# Patient Record
Sex: Male | Born: 1975
Health system: Southern US, Community
[De-identification: ages and names within clinical notes are randomized; demographics above are authoritative.]

## PROBLEM LIST (undated history)

## (undated) DIAGNOSIS — K219 Gastro-esophageal reflux disease without esophagitis: Secondary | ICD-10-CM

## (undated) DIAGNOSIS — N200 Calculus of kidney: Secondary | ICD-10-CM

## (undated) DIAGNOSIS — F419 Anxiety disorder, unspecified: Secondary | ICD-10-CM

## (undated) HISTORY — DX: Anxiety disorder, unspecified: F41.9

## (undated) HISTORY — PX: KIDNEY STONE SURGERY: SHX686

## (undated) HISTORY — PX: HERNIA REPAIR: SHX51

## (undated) HISTORY — PX: LITHOTRIPSY: SUR834

## (undated) HISTORY — DX: Gastro-esophageal reflux disease without esophagitis: K21.9

## (undated) HISTORY — DX: Calculus of kidney: N20.0

---

## 2004-06-17 ENCOUNTER — Emergency Department: Payer: Self-pay | Admitting: Emergency Medicine

## 2004-10-27 ENCOUNTER — Ambulatory Visit: Payer: Self-pay

## 2008-03-22 ENCOUNTER — Ambulatory Visit: Payer: Self-pay | Admitting: Urology

## 2010-01-01 ENCOUNTER — Ambulatory Visit: Payer: Self-pay | Admitting: Family Medicine

## 2010-04-29 ENCOUNTER — Emergency Department (HOSPITAL_COMMUNITY)
Admission: EM | Admit: 2010-04-29 | Discharge: 2010-04-30 | Payer: Self-pay | Source: Home / Self Care | Admitting: Emergency Medicine

## 2010-04-29 ENCOUNTER — Ambulatory Visit: Payer: Self-pay | Admitting: Urology

## 2010-07-12 ENCOUNTER — Emergency Department (HOSPITAL_COMMUNITY): Payer: PRIVATE HEALTH INSURANCE

## 2010-07-12 ENCOUNTER — Emergency Department (HOSPITAL_COMMUNITY)
Admission: EM | Admit: 2010-07-12 | Discharge: 2010-07-12 | Disposition: A | Discharge status: Home / Self Care | Payer: PRIVATE HEALTH INSURANCE | Attending: Emergency Medicine | Admitting: Emergency Medicine

## 2010-07-12 DIAGNOSIS — R079 Chest pain, unspecified: Secondary | ICD-10-CM | POA: Insufficient documentation

## 2010-07-12 DIAGNOSIS — M62838 Other muscle spasm: Secondary | ICD-10-CM | POA: Insufficient documentation

## 2010-07-12 DIAGNOSIS — M546 Pain in thoracic spine: Secondary | ICD-10-CM | POA: Insufficient documentation

## 2010-07-12 LAB — URINALYSIS, ROUTINE W REFLEX MICROSCOPIC
Bilirubin Urine: NEGATIVE
Glucose, UA: NEGATIVE mg/dL
Hgb urine dipstick: NEGATIVE
Ketones, ur: NEGATIVE mg/dL
Nitrite: NEGATIVE
Protein, ur: NEGATIVE mg/dL
Specific Gravity, Urine: 1.006 (ref 1.005–1.030)
Urobilinogen, UA: 0.2 mg/dL (ref 0.0–1.0)
pH: 6 (ref 5.0–8.0)

## 2010-07-14 LAB — DIFFERENTIAL
Basophils Absolute: 0 10*3/uL (ref 0.0–0.1)
Basophils Relative: 0 % (ref 0–1)
Eosinophils Absolute: 0 10*3/uL (ref 0.0–0.7)
Eosinophils Relative: 0 % (ref 0–5)
Lymphocytes Relative: 15 % (ref 12–46)
Lymphs Abs: 1.8 10*3/uL (ref 0.7–4.0)
Monocytes Absolute: 0.7 10*3/uL (ref 0.1–1.0)
Monocytes Relative: 6 % (ref 3–12)
Neutro Abs: 9.4 10*3/uL — ABNORMAL HIGH (ref 1.7–7.7)
Neutrophils Relative %: 79 % — ABNORMAL HIGH (ref 43–77)

## 2010-07-14 LAB — URINALYSIS, ROUTINE W REFLEX MICROSCOPIC
Bilirubin Urine: NEGATIVE
Glucose, UA: NEGATIVE mg/dL
Hgb urine dipstick: NEGATIVE
Ketones, ur: 40 mg/dL — AB
Nitrite: NEGATIVE
Protein, ur: NEGATIVE mg/dL
Specific Gravity, Urine: 1.02 (ref 1.005–1.030)
Urobilinogen, UA: 0.2 mg/dL (ref 0.0–1.0)
pH: 7 (ref 5.0–8.0)

## 2010-07-14 LAB — BASIC METABOLIC PANEL
BUN: 7 mg/dL (ref 6–23)
CO2: 26 mEq/L (ref 19–32)
Calcium: 9.3 mg/dL (ref 8.4–10.5)
Chloride: 104 mEq/L (ref 96–112)
Creatinine, Ser: 0.89 mg/dL (ref 0.4–1.5)
GFR calc Af Amer: >60 mL/min (ref >60)
GFR calc non Af Amer: >60 mL/min (ref >60)
Glucose, Bld: 95 mg/dL (ref 70–99)
Potassium: 3.8 mEq/L (ref 3.5–5.1)
Sodium: 138 mEq/L (ref 135–145)

## 2010-07-14 LAB — CBC
HCT: 44.7 % (ref 39.0–52.0)
Hemoglobin: 15.6 g/dL (ref 13.0–17.0)
MCH: 31.2 pg (ref 26.0–34.0)
MCHC: 34.9 g/dL (ref 30.0–36.0)
MCV: 89.4 fL (ref 78.0–100.0)
Platelets: 287 10*3/uL (ref 150–400)
RBC: 5 MIL/uL (ref 4.22–5.81)
RDW: 11.9 % (ref 11.5–15.5)
WBC: 12 10*3/uL — ABNORMAL HIGH (ref 4.0–10.5)

## 2010-08-28 ENCOUNTER — Ambulatory Visit (INDEPENDENT_AMBULATORY_CARE_PROVIDER_SITE_OTHER): Payer: PRIVATE HEALTH INSURANCE | Admitting: Family Medicine

## 2010-08-28 DIAGNOSIS — R1031 Right lower quadrant pain: Secondary | ICD-10-CM

## 2010-09-04 ENCOUNTER — Encounter: Payer: Self-pay | Admitting: Family Medicine

## 2010-09-04 ENCOUNTER — Other Ambulatory Visit: Payer: Self-pay

## 2010-10-01 ENCOUNTER — Telehealth: Payer: Self-pay | Admitting: Family Medicine

## 2010-10-01 DIAGNOSIS — R1031 Right lower quadrant pain: Secondary | ICD-10-CM

## 2010-10-01 NOTE — Telephone Encounter (Signed)
Saw surgeon, who reviewed CT scan.  Said there was nothing surgical.  Bowels are normal, moving regularly, but still having ongoing pain.  Refer to GI for further evaluation of RLQ abdominal pain

## 2010-10-02 ENCOUNTER — Institutional Professional Consult (permissible substitution): Payer: PRIVATE HEALTH INSURANCE | Admitting: Family Medicine

## 2010-10-02 ENCOUNTER — Encounter: Payer: Self-pay | Admitting: Gastroenterology

## 2010-10-02 NOTE — Telephone Encounter (Signed)
Sent to veronica to refer

## 2010-10-27 ENCOUNTER — Ambulatory Visit (INDEPENDENT_AMBULATORY_CARE_PROVIDER_SITE_OTHER): Payer: PRIVATE HEALTH INSURANCE | Admitting: Gastroenterology

## 2010-10-27 ENCOUNTER — Encounter: Payer: Self-pay | Admitting: Gastroenterology

## 2010-10-27 VITALS — BP 100/72 | HR 72 | Ht 67.0 in | Wt 207.0 lb

## 2010-10-27 DIAGNOSIS — R1031 Right lower quadrant pain: Secondary | ICD-10-CM

## 2010-10-27 NOTE — Progress Notes (Signed)
History of Present Illness: This is a 35 year old male with a six-month history of mild right lower quadrant discomfort. He has been evaluated in the emergency department at [facility] and [facility]. He describes a dull aching sensation in his right lower abdomen that is noticeable when he is sitting at work. When he is standing, walking or lying down he does not have the symptoms. The symptoms are not related to any digestive function. He notes no change in bowel habits, diarrhea, constipation, gas, bloating, reflux symptoms, chest pain, nausea, vomiting weight loss melena or hematochezia. I reviewed available records from December 2011 and March 2012. Blood work and urinalysis was unremarkable. He had a CT scan of the abdomen and pelvis without contrast on 04/29/2010 that showed 2 small nonobstructing stones measuring 1 mm in diameter in the right renal collecting system and no other abnormalities. He evaluated by Dr. Jimmye Norman at [facility] on Sep 30, 2010 and he did not find any evidence of a hernia or any other gastrointestinal pathology.  Review of Systems: Pertinent positive and negative review of systems were noted in the above HPI section. All other review of systems were otherwise negative.  Current Medications, Allergies, Past Medical History, Past Surgical History, Family History and Social History were reviewed in [employer] record.  Physical Exam: General: Well developed , well nourished, no acute distress Head: Normocephalic and atraumatic Eyes:  sclerae anicteric, EOMI Ears: Normal auditory acuity Mouth: No deformity or lesions Neck: Supple, no masses or thyromegaly Lungs: Clear throughout to auscultation Heart: Regular rate and rhythm; no murmurs, rubs or bruits Abdomen: Soft, non tender and non distended. No masses, hepatosplenomegaly or hernias noted. Normal Bowel sounds Musculoskeletal: Symmetrical with no gross  deformities  Skin: No lesions on visible extremities Pulses:  Normal pulses noted Extremities: No clubbing, cyanosis, edema or deformities noted Neurological: Alert oriented x 4, grossly nonfocal Cervical Nodes:  No significant cervical adenopathy Inguinal Nodes: No significant inguinal adenopathy Psychological:  Alert and cooperative. Normal mood and affect  Assessment and Recommendations:  1. Mild ongoing right lower quadrant discomfort which has musculoskeletal features. History, physical exam, blood work and CT scan do not suggest any evidence of a gastrointestinal process. I've advised him not to remain in seated positions for long periods of time and to get up from his desk to stretch and walk frequently during his daily routine at his office. No further GI workup indicated at this time. If he develops any typical gastrointestinal symptoms he should return for follow up. Ongoing followup with his primary physician.

## 2010-10-27 NOTE — Patient Instructions (Signed)
GI follow up as needed cc: Sharlot Gowda, MD

## 2011-01-01 ENCOUNTER — Ambulatory Visit: Payer: Self-pay | Admitting: Urology

## 2011-02-26 ENCOUNTER — Encounter: Payer: Self-pay | Admitting: Family Medicine

## 2011-02-26 ENCOUNTER — Ambulatory Visit (INDEPENDENT_AMBULATORY_CARE_PROVIDER_SITE_OTHER): Payer: PRIVATE HEALTH INSURANCE | Admitting: Family Medicine

## 2011-02-26 VITALS — BP 104/70 | HR 72 | Temp 98.3°F | Ht 67.0 in | Wt 210.0 lb

## 2011-02-26 DIAGNOSIS — R1031 Right lower quadrant pain: Secondary | ICD-10-CM

## 2011-02-26 MED ORDER — NAPROXEN 500 MG PO TABS: 500.0000 mg | ORAL_TABLET | Freq: Two times a day (BID) | ORAL | Status: AC

## 2011-02-26 NOTE — Progress Notes (Signed)
Patient presents for follow up on RLQ abdominal pain. He was seen by me in April, subsequently saw surgeon who didn't feel surgical treatment of small umbilical hernia was warranted; saw GI (Dr. Russella Dar) 6/25, who felt there was no GI component, suspected musculoskeletal etiology. Patient has had ongoing, intermittent discomfort/"agitation" since that time.  Finds discomfort is worse with sitting, relieved by standing or lying down.  Sometimes he'll take an Aleve, doesn't think it helps.  Described as 2-3/10 pain.  Past Medical History  Diagnosis Date  . GERD (gastroesophageal reflux disease)   . Calcium nephrolithiasis     Past Surgical History  Procedure Date  . Hernia repair     R inguinal  . Kidney stone surgery     basket retrieval  . Lithotripsy     x2    History   Social History  . Marital Status: Married    Spouse Name: N/A    Number of Children: 1  . Years of Education: N/A   Occupational History  . insurance agent    Social History Main Topics  . Smoking status: Former Smoker    Quit date: 05/04/2008  . Smokeless tobacco: Never Used  . Alcohol Use: Yes     1 drink per month.  . Drug Use: No  . Sexually Active: Not on file   Other Topics Concern  . Not on file   Social History Narrative  . No narrative on file    Family History  Problem Relation Age of Onset  . Allergies Maternal Grandfather   . Vision loss Maternal Grandfather   . Emphysema Maternal Grandfather   . Nephrolithiasis Maternal Grandfather   . Colon cancer Paternal Grandfather   . Cancer Paternal Grandfather     colon cancer   No current outpatient prescriptions on file prior to visit.   Allergies  Allergen Reactions  . Penicillins Anaphylaxis   ROS:  Denies fevers, bowel changes, nausea, vomiting, skin rash, URI symptoms, cough, shortness of breath, chest pain or other concerns  PHYSICAL EXAM: BP 104/70  Pulse 72  Temp 98.3 F (36.8 C)  Ht 5\' 7"  (1.702 m)  Wt 210 lb (95.255 kg)   BMI 32.89 kg/m2 Well developed, pleasant male in no distress Neck: no lymphadenopathy or mass Heart: regular rate and rhythm without murmur Lungs: clear bilaterally Abdomen: nontender at RLQ, but area of discomfort is midway between umbilicus and hip, not reproducible.  Tender at anterior pelvic/hip, very mildly.  Very small, easily reducible umbilical hernia  ASSESSMENT/PLAN: 1. RLQ abdominal pain  naproxen (NAPROSYN) 500 MG tablet   Abdominal pain--I suspect this is either musculoskeletal (from previous injury/tear, with ongoing inflammation at hip), or possibly a thoracic radiculopathy.  Will try course of NSAIDs for up to 2 weeks, and trial of chiropractic care with Dr. Vear Clock.  NSAID precautions reviewed

## 2011-02-26 NOTE — Patient Instructions (Signed)
We discussed possible etiologies of your pain--musculoskeletal (related to muscles in lower abdomen, attaching to bone) versus the possibility of irritated/inflamed nerve root in your thoracic spine.  Trial of anti-inflammatories.  Remember not to take any other medications for pain other than tylenol while taking the naproxen.  Trial of chiropractic treatment--I recommend Dr. Thereasa Distance at [facility] on [address] [facility]  Try heat and/or ice, and gentle stretches

## 2011-03-06 ENCOUNTER — Encounter: Payer: Self-pay | Admitting: Family Medicine

## 2011-03-06 ENCOUNTER — Ambulatory Visit (INDEPENDENT_AMBULATORY_CARE_PROVIDER_SITE_OTHER): Payer: PRIVATE HEALTH INSURANCE | Admitting: Family Medicine

## 2011-03-06 DIAGNOSIS — R109 Unspecified abdominal pain: Secondary | ICD-10-CM

## 2011-03-06 MED ORDER — HYOSCYAMINE SULFATE 0.125 MG SL SUBL: 0.1250 mg | SUBLINGUAL_TABLET | Freq: Four times a day (QID) | SUBLINGUAL | Status: DC | PRN

## 2011-03-06 NOTE — Patient Instructions (Signed)
I suspect a possible viral etiology of abdominal pain and loose stools. NSAIDs (naproxen) may be contributing.  Stop Naproxen.  BRAT diet (bananas, rice, apple sauce and toast), bland foods, avoid dairy.  Trial of anti-spasmodic per patient request.  Tylenol as needed for pain.  Need to f/u if worsening abdominal pain, ongoing rectal bleeding, or other concerns

## 2011-03-06 NOTE — Progress Notes (Signed)
Awoke at 1 am with abdominal cramping.  Gets the urge to defecate frequently, passes small amounts of loose stool.  Went to the bathroom about 10 times during the night.  This morning at 9 am noted BRB in the toilet and on paper.  Strained like he had to have a bowel movement, but didn't actually have one, just small amount of blood noted in toilet and on paper.  Denies nausea and vomiting, just severe cramping.  Ate some yogurt this morning, and cramps have subsided just a bit.  Has been taking naproxen for about 6 days for suspected musculoskeletal RLQ abdominal pain, and pain felt like it was getting a little better overall, until pain started acutely last night.  Had pizza last night, no alcohol.  No other sick contacts.  No fevers, no recent antibiotics.  Past Medical History  Diagnosis Date  . GERD (gastroesophageal reflux disease)   . Calcium nephrolithiasis     Past Surgical History  Procedure Date  . Hernia repair     R inguinal  . Kidney stone surgery     basket retrieval  . Lithotripsy     x2    History   Social History  . Marital Status: Married    Spouse Name: N/A    Number of Children: 1  . Years of Education: N/A   Occupational History  . insurance agent    Social History Main Topics  . Smoking status: Former Smoker    Quit date: 05/04/2008  . Smokeless tobacco: Never Used  . Alcohol Use: Yes     1 drink per month.  . Drug Use: No  . Sexually Active: Not on file   Other Topics Concern  . Not on file   Social History Narrative  . No narrative on file    Family History  Problem Relation Age of Onset  . Allergies Maternal Grandfather   . Vision loss Maternal Grandfather   . Emphysema Maternal Grandfather   . Nephrolithiasis Maternal Grandfather   . Colon cancer Paternal Grandfather   . Cancer Paternal Grandfather     colon cancer   Current Outpatient Prescriptions on File Prior to Visit  Medication Sig Dispense Refill  . naproxen (NAPROSYN) 500  MG tablet Take 1 tablet (500 mg total) by mouth 2 (two) times daily with a meal.  30 tablet  0    Allergies  Allergen Reactions  . Penicillins Anaphylaxis   ROS:  Denies fevers, nausea, vomiting, URI symptoms, cough, chest pain, shortness of breath, rash.  See HPI  PHYSICAL EXAM: BP 112/72  Pulse 72  Temp(Src) 98.4 F (36.9 C) (Oral)  Ht 5\' 7"  (1.702 m)  Wt 210 lb (95.255 kg)  BMI 32.89 kg/m2 Tired appearing male, otherwise in no acute distress. Describes pain as crampy, twisting Heart: regular rate and rhythm without murmur Lungs: clear bilaterally Abdomen: active bowel sounds.  No epigastric tenderness.  Mildly tender (described as "fullness") at RLQ--area of previous discomfort).  Mildly tender at ASIS--improved from last visit  Patient refused rectal exam  ASSESSMENT/PLAN: 1. Abdominal pain  hyoscyamine (LEVSIN/SL) 0.125 MG SL tablet   Suspect AGE.   NSAIDs may be contributing.  Stop Naproxen.  BRAT diet, bland foods, avoid dairy.  Trial of anti-spasmodic per patient request.  Tylenol as needed for pain.  Need to f/u if worsening abdominal pain, ongoing rectal bleeding, or other concerns

## 2012-04-01 ENCOUNTER — Emergency Department (HOSPITAL_COMMUNITY)
Admission: EM | Admit: 2012-04-01 | Discharge: 2012-04-01 | Disposition: A | Discharge status: Home / Self Care | Payer: PRIVATE HEALTH INSURANCE | Attending: Emergency Medicine | Admitting: Emergency Medicine

## 2012-04-01 ENCOUNTER — Emergency Department (HOSPITAL_COMMUNITY): Payer: PRIVATE HEALTH INSURANCE

## 2012-04-01 ENCOUNTER — Encounter (HOSPITAL_COMMUNITY): Payer: Self-pay | Admitting: *Deleted

## 2012-04-01 DIAGNOSIS — R3915 Urgency of urination: Secondary | ICD-10-CM | POA: Insufficient documentation

## 2012-04-01 DIAGNOSIS — Z9889 Other specified postprocedural states: Secondary | ICD-10-CM | POA: Insufficient documentation

## 2012-04-01 DIAGNOSIS — R110 Nausea: Secondary | ICD-10-CM | POA: Insufficient documentation

## 2012-04-01 DIAGNOSIS — N200 Calculus of kidney: Secondary | ICD-10-CM

## 2012-04-01 DIAGNOSIS — Z8719 Personal history of other diseases of the digestive system: Secondary | ICD-10-CM | POA: Insufficient documentation

## 2012-04-01 DIAGNOSIS — Z87891 Personal history of nicotine dependence: Secondary | ICD-10-CM | POA: Insufficient documentation

## 2012-04-01 LAB — CBC WITH DIFFERENTIAL/PLATELET
Basophils Absolute: 0 10*3/uL (ref 0.0–0.1)
Basophils Relative: 0 % (ref 0–1)
Eosinophils Absolute: 0.1 10*3/uL (ref 0.0–0.7)
Eosinophils Relative: 1 % (ref 0–5)
HCT: 42.8 % (ref 39.0–52.0)
Hemoglobin: 15.2 g/dL (ref 13.0–17.0)
Lymphocytes Relative: 18 % (ref 12–46)
Lymphs Abs: 1.9 10*3/uL (ref 0.7–4.0)
MCH: 31.1 pg (ref 26.0–34.0)
MCHC: 35.5 g/dL (ref 30.0–36.0)
MCV: 87.7 fL (ref 78.0–100.0)
Monocytes Absolute: 0.8 10*3/uL (ref 0.1–1.0)
Monocytes Relative: 7 % (ref 3–12)
Neutro Abs: 8 10*3/uL — ABNORMAL HIGH (ref 1.7–7.7)
Neutrophils Relative %: 74 % (ref 43–77)
Platelets: 312 10*3/uL (ref 150–400)
RBC: 4.88 MIL/uL (ref 4.22–5.81)
RDW: 11.7 % (ref 11.5–15.5)
WBC: 10.9 10*3/uL — ABNORMAL HIGH (ref 4.0–10.5)

## 2012-04-01 LAB — URINALYSIS, ROUTINE W REFLEX MICROSCOPIC
Bilirubin Urine: NEGATIVE
Glucose, UA: NEGATIVE mg/dL
Hgb urine dipstick: NEGATIVE
Ketones, ur: NEGATIVE mg/dL
Leukocytes, UA: NEGATIVE
Nitrite: NEGATIVE
Protein, ur: NEGATIVE mg/dL
Specific Gravity, Urine: 1.025 (ref 1.005–1.030)
Urobilinogen, UA: 0.2 mg/dL (ref 0.0–1.0)
pH: 7.5 (ref 5.0–8.0)

## 2012-04-01 LAB — COMPREHENSIVE METABOLIC PANEL
ALT: 36 U/L (ref 0–53)
AST: 21 U/L (ref 0–37)
Albumin: 4.1 g/dL (ref 3.5–5.2)
Alkaline Phosphatase: 58 U/L (ref 39–117)
BUN: 16 mg/dL (ref 6–23)
CO2: 25 mEq/L (ref 19–32)
Calcium: 9.8 mg/dL (ref 8.4–10.5)
Chloride: 102 mEq/L (ref 96–112)
Creatinine, Ser: 1.01 mg/dL (ref 0.50–1.35)
GFR calc Af Amer: >90 mL/min (ref >90)
GFR calc non Af Amer: >90 mL/min (ref >90)
Glucose, Bld: 100 mg/dL — ABNORMAL HIGH (ref 70–99)
Potassium: 3.8 mEq/L (ref 3.5–5.1)
Sodium: 136 mEq/L (ref 135–145)
Total Bilirubin: 0.3 mg/dL (ref 0.3–1.2)
Total Protein: 7.4 g/dL (ref 6.0–8.3)

## 2012-04-01 MED ORDER — KETOROLAC TROMETHAMINE 10 MG PO TABS: 10.0000 mg | ORAL_TABLET | Freq: Four times a day (QID) | ORAL | Status: DC | PRN

## 2012-04-01 MED ORDER — SODIUM CHLORIDE 0.9 % IV SOLN
1000.0000 mL | Freq: Once | INTRAVENOUS | Status: AC
  Administered 2012-04-01: 1000 mL via INTRAVENOUS

## 2012-04-01 MED ORDER — TAMSULOSIN HCL 0.4 MG PO CAPS: 0.4000 mg | ORAL_CAPSULE | Freq: Every day | ORAL | Status: DC

## 2012-04-01 MED ORDER — HYDROMORPHONE HCL PF 1 MG/ML IJ SOLN
1.0000 mg | INTRAMUSCULAR | Status: DC | PRN
  Administered 2012-04-01: 1 mg via INTRAVENOUS
  Filled 2012-04-01 (×2): qty 1

## 2012-04-01 MED ORDER — HYDROCODONE-ACETAMINOPHEN 5-325 MG PO TABS: 2.0000 | ORAL_TABLET | Freq: Three times a day (TID) | ORAL | Status: DC | PRN

## 2012-04-01 MED ORDER — HYDROMORPHONE HCL PF 1 MG/ML IJ SOLN
1.0000 mg | Freq: Once | INTRAMUSCULAR | Status: AC
  Administered 2012-04-01: 1 mg via INTRAVENOUS

## 2012-04-01 MED ORDER — KETOROLAC TROMETHAMINE 30 MG/ML IJ SOLN
30.0000 mg | Freq: Once | INTRAMUSCULAR | Status: AC
  Administered 2012-04-01: 30 mg via INTRAVENOUS
  Filled 2012-04-01: qty 1

## 2012-04-01 MED ORDER — ONDANSETRON HCL 4 MG/2ML IJ SOLN
4.0000 mg | Freq: Once | INTRAMUSCULAR | Status: AC
  Administered 2012-04-01: 4 mg via INTRAVENOUS
  Filled 2012-04-01: qty 2

## 2012-04-01 NOTE — ED Provider Notes (Signed)
History     CSN: [id number]  Arrival date & time 04/01/12  1534   First MD Initiated Contact with Patient 04/01/12 1644      Chief Complaint  Patient presents with  . Flank Pain    (Consider location/radiation/quality/duration/timing/severity/associated sxs/prior treatment) Patient is a 36 y.o. male presenting with flank pain.  Flank Pain   this patient with a history of kidney stones, including prior surgical intervention, lithotripsy, now presents with flank pain.  This episode began several days ago, has become significantly worse over the past 2 days.  The pain is most prominently in the right lower back, with minimal radiation anteriorly.  It is sore, not relieved with OTC medication or tramadol.  There is associated generalized discomfort, nausea.  No vomiting, no dysuria.  The patient endorses chills, denies fevers. The patient states that this pain is very similar to prior kidney stones.  Past Medical History  Diagnosis Date  . GERD (gastroesophageal reflux disease)   . Calcium nephrolithiasis     Past Surgical History  Procedure Date  . Hernia repair     R inguinal  . Kidney stone surgery     basket retrieval  . Lithotripsy     x2    Family History  Problem Relation Age of Onset  . Allergies Maternal Grandfather   . Vision loss Maternal Grandfather   . Emphysema Maternal Grandfather   . Nephrolithiasis Maternal Grandfather   . Colon cancer Paternal Grandfather   . Cancer Paternal Grandfather     colon cancer    History  Substance Use Topics  . Smoking status: Former Smoker    Quit date: 05/04/2008  . Smokeless tobacco: Never Used  . Alcohol Use: Yes     Comment: 1 drink per month.      Review of Systems  Constitutional:       Per HPI, otherwise negative  HENT:       Per HPI, otherwise negative  Eyes: Negative.   Respiratory:       Per HPI, otherwise negative  Cardiovascular:       Per HPI, otherwise negative  Gastrointestinal: Positive  for nausea. Negative for vomiting.  Genitourinary: Positive for urgency and flank pain. Negative for penile pain and testicular pain.  Musculoskeletal:       Per HPI, otherwise negative  Skin: Negative.   Neurological: Negative for syncope.    Allergies  Penicillins  Home Medications   Current Outpatient Rx  Name  Route  Sig  Dispense  Refill  . IBUPROFEN 800 MG PO TABS   Oral   Take 800 mg by mouth every 8 (eight) hours as needed. For pain         . TRAMADOL HCL 50 MG PO TABS   Oral   Take 50 mg by mouth every 6 (six) hours as needed. For pain           BP 116/78  Pulse 75  Temp 98 F (36.7 C) (Oral)  Resp 22  SpO2 99%  Physical Exam  Nursing note and vitals reviewed. Constitutional: He is oriented to person, place, and time. He appears well-developed. No distress.       Uncomfortable appearing, though in no distress  HENT:  Head: Normocephalic and atraumatic.  Eyes: Conjunctivae normal and EOM are normal.  Cardiovascular: Normal rate and regular rhythm.   Pulmonary/Chest: Effort normal. No stridor. No respiratory distress.  Abdominal: He exhibits no distension. There is no tenderness. There is no  rebound.  Musculoskeletal: He exhibits no edema.  Neurological: He is alert and oriented to person, place, and time.  Skin: Skin is warm and dry.  Psychiatric: He has a normal mood and affect.    ED Course  Procedures (including critical care time)  Labs Reviewed  CBC WITH DIFFERENTIAL - Abnormal; Notable for the following:    WBC 10.9 (*)     Neutro Abs 8.0 (*)     All other components within normal limits  COMPREHENSIVE METABOLIC PANEL - Abnormal; Notable for the following:    Glucose, Bld 100 (*)     All other components within normal limits  URINALYSIS, ROUTINE W REFLEX MICROSCOPIC   No results found.   No diagnosis found.    MDM  This patient with a history of nephrolithiasis presents with ongoing low back pain, concerns for nephrolithiasis  versus bronchitis versus other acute GI or GU pathology.  On exam the patient is uncomfortable appearing, though in no distress.  The patient has unremarkable vital signs.  The patient's CT scan demonstrates multiple bilateral kidney stones, suggestions of possible recent passage of stone.  Given this, there is suspicion of ureteral colic.  There is low suspicion for significant ongoing infection, and no evidence of obstruction.  The patient was discharged in stable condition to follow up with his urologist.  Gerhard Munch, MD 04/01/12 1818

## 2012-04-01 NOTE — ED Notes (Signed)
Pt back from CT

## 2012-04-01 NOTE — ED Notes (Signed)
Discharge instructions reviewed. Pt verbalized understanding.  

## 2012-04-01 NOTE — ED Notes (Signed)
Pt states he has been having sharp constant rt sided flank pain for about a week. He states today it got worse. Rates pain 10/10.  Pt states he has a hx of kidney stones and this feels like the same thing.

## 2012-04-01 NOTE — ED Notes (Signed)
Patient transported to CT 

## 2012-04-01 NOTE — ED Notes (Signed)
Rt flank pain for 3 days worse for the past 2 hours. He saw adoctor yesterday that was treating him for back pain.  Hx of kidney stones

## 2012-04-12 ENCOUNTER — Telehealth: Payer: Self-pay | Admitting: Gastroenterology

## 2012-04-12 NOTE — Telephone Encounter (Signed)
Patient c/o pain in his back and right side ribs.  He was seen in the ER last week for the same pain.  They sent him to urology due to kidney stones.  He was evaluated by a urologist an [facility] and she told him to see GI for [place] and rule out gallbladder.  He is tolerating a diet, but pain is terrible.  Similar pain that he had last year, but he states that that pain was in the front of his abdomen and this is mainly his back and side.  He states that only labs and x-rays were performed in the ER last week.  He is scheduled to see Mike Gip PA tomorrow at 9:30.

## 2012-04-13 ENCOUNTER — Ambulatory Visit (HOSPITAL_COMMUNITY)
Admission: RE | Admit: 2012-04-13 | Discharge: 2012-04-13 | Disposition: A | Discharge status: Home / Self Care | Payer: PRIVATE HEALTH INSURANCE | Source: Ambulatory Visit | Attending: Physician Assistant | Admitting: Physician Assistant

## 2012-04-13 ENCOUNTER — Ambulatory Visit (INDEPENDENT_AMBULATORY_CARE_PROVIDER_SITE_OTHER): Payer: PRIVATE HEALTH INSURANCE | Admitting: Physician Assistant

## 2012-04-13 ENCOUNTER — Encounter: Payer: Self-pay | Admitting: Physician Assistant

## 2012-04-13 VITALS — BP 120/70 | HR 70 | Ht 66.5 in | Wt 197.8 lb

## 2012-04-13 DIAGNOSIS — Z87442 Personal history of urinary calculi: Secondary | ICD-10-CM

## 2012-04-13 DIAGNOSIS — R109 Unspecified abdominal pain: Secondary | ICD-10-CM

## 2012-04-13 DIAGNOSIS — M546 Pain in thoracic spine: Secondary | ICD-10-CM

## 2012-04-13 DIAGNOSIS — M549 Dorsalgia, unspecified: Secondary | ICD-10-CM | POA: Insufficient documentation

## 2012-04-13 NOTE — Progress Notes (Signed)
Subjective:    Patient ID: Arthur Ho, male    DOB: [birth year], 36 y.o.   MRN: [id number]  HPI  Arthur Ho is a very nice 36 year old white male known to Dr. Russella Dar who was last seen in 2000. He had undergone workup at that time with complaints of right lower quadrant abdominal pain. All of his GI evaluation was negative though he was found on CT scan to have nonobstructing kidney stones. He also was seen by Dr. Lindie Spruce for or surgery who did not find any evidence of GI pathology. Patient says he has had several kidney stones over time and also lithotripsy. He developed acute right back pain which radiates around into the right upper quadrant about 2 weeks ago. He says this was very abrupt onset and has been fairly constant since area he says the pain is not present 24 hours a day but he gets episodes of very sharp right-sided back pain. He says if he lies on his right side curled up this generally helps relieve the pain and lying on his back generally makes it feel worse. He also feels worse with bending forward. He has not had any known injury heavy lifting etc. and no prior history of disc disease. He has not had any fever or chills. Says his appetite has not been very good and he is lost about 16 pounds in the past 2 weeks . He admits he's had been afraid to eat because his initial episode started about 2 hours after he ate a very large taken she stopped and he is afraid to eat he may exacerbate the pain. However he is really not noticed any increase in his symptoms with eating and has no complaints of nausea vomiting heartburn etc. He says his bowel movements have been somewhat loose with 1-2 bowel movements per day nonbloody. He has no urinary symptoms and has not had any recent hematuria. 04/01/2012 with this pain and had CT scan of the abdomen and pelvis done without contrast this showed several punctate stones within each kidney but no stones in the ureter there was some symmetric fullness of both  collecting systems. He has a normal appendix. Labs at that time were unremarkable with the exception of a WBC of 10.9. He was referred to urologist and was seen by a Dr. Leatrice Jewels at [facility] who told him this pain was not from kidney stones and that he should have GI evaluation.    Review of Systems  Constitutional: Positive for appetite change and unexpected weight change.  HENT: Negative.   Eyes: Negative.   Respiratory: Negative.   Cardiovascular: Negative.   Gastrointestinal: Positive for abdominal pain and diarrhea.  Genitourinary: Negative.   Musculoskeletal: Positive for back pain.  Neurological: Negative.   Hematological: Negative.   Psychiatric/Behavioral: Negative.    Outpatient Prescriptions Prior to Visit  Medication Sig Dispense Refill  . ibuprofen (ADVIL,MOTRIN) 800 MG tablet Take 800 mg by mouth every 8 (eight) hours as needed. For pain      . ketorolac (TORADOL) 10 MG tablet Take 1 tablet (10 mg total) by mouth every 6 (six) hours as needed for pain.  20 tablet  0  . HYDROcodone-acetaminophen (NORCO/VICODIN) 5-325 MG per tablet Take 2 tablets by mouth every 8 (eight) hours as needed for pain.  15 tablet  0  . Tamsulosin HCl (FLOMAX) 0.4 MG CAPS Take 1 capsule (0.4 mg total) by mouth daily.  30 capsule  0   Last reviewed on 04/13/2012 12:41 PM by Amy  Oswald Hillock, PA      Allergies  Allergen Reactions  . Penicillins Anaphylaxis   Patient Active Problem List  Diagnosis  . History of kidney stones   History  Substance Use Topics  . Smoking status: Former Smoker    Quit date: 05/04/2008  . Smokeless tobacco: Never Used  . Alcohol Use: No    Objective:   Physical Exam well-developed young white male in no acute distress, somewhat anxious blood pressure 120/70 pulse 70 height 5 foot 6 weight 197. HEENT; nontraumatic normocephalic EOMI PERRLA sclera anicteric,Neck; Supple no JVD, Cardiovascular; regular rate and rhythm with S1-S2 no murmur or gallop, Pulmonary ;clear  bilaterally, Abdomen; soft basically nontender in the abdomen no palpable mass or hepatosplenomegaly bowel sounds are present, and he is tender with palpation of the right lateral costal margin and exam of the back shows tenderness along the right posterior lower ribs in the T11-T12 region, Rectal; exam not done, Extremities; no clubbing cyanosis or edema skin warm and dry, Psych; mood and affect normal and appropriate        Assessment & Plan:  #23  36 year old male with history of ureterolithiasis now presenting with two-week history of fairly constant sharp right mid back pain radiating into the right upper quadrant, right lateral abdomen Etiology of his pain is not clear however recent noncontrasted CT scan on 04/01/2012 showed evidence of kidney stones but no ureterolithiasis I am not sure that his pain is of GI etiology, specifically doubt gallbladder disease though cannot rule out. I suspect his pain is musculoskeletal in etiology and that he may be having a radicular pain from a lower thoracic disc.  Plan; He may continue to use anti-inflammatories and/or Vicodin as needed for pain Schedule for upper abdominal ultrasound Scheduled for MRI of the lower thoracic and upper lumbar spine. Further plans pending findings of above.

## 2012-04-13 NOTE — Patient Instructions (Addendum)
The Ultrasound is scheduled for today,  At [facility] Rad[facility], 1st floor, 12-11- at 2 PM.  Please arrive at 1:45 Pm. Have nothing by mouth until after the test. We will contact you with the results.  We scheduled the MRI at [facility] as well. Date is Fri 04-15-2012.  Arrive at 8:45AM. No diet restrictions.

## 2012-04-13 NOTE — Progress Notes (Signed)
Reviewed and agree with management plan. If his symptoms appear to be musculoskeletal he needs follow up with his PCP. Await MRI result.  Venita Lick. Russella Dar MD Clementeen Graham

## 2012-04-14 ENCOUNTER — Telehealth: Payer: Self-pay | Admitting: Physician Assistant

## 2012-04-14 NOTE — Telephone Encounter (Signed)
Called patient to advise, per Amy Estserwood PA-C, the Ultrasound was negative. It showed no Gallbladder problems, no gallstones as well.

## 2012-04-15 ENCOUNTER — Other Ambulatory Visit: Payer: Self-pay | Admitting: Physician Assistant

## 2012-04-15 ENCOUNTER — Ambulatory Visit (HOSPITAL_COMMUNITY)
Admission: RE | Admit: 2012-04-15 | Discharge: 2012-04-15 | Disposition: A | Discharge status: Home / Self Care | Payer: PRIVATE HEALTH INSURANCE | Source: Ambulatory Visit | Attending: Physician Assistant | Admitting: Physician Assistant

## 2012-04-15 DIAGNOSIS — IMO0002 Reserved for concepts with insufficient information to code with codable children: Secondary | ICD-10-CM | POA: Insufficient documentation

## 2012-04-15 DIAGNOSIS — M546 Pain in thoracic spine: Secondary | ICD-10-CM

## 2012-04-15 DIAGNOSIS — M5144 Schmorl's nodes, thoracic region: Secondary | ICD-10-CM | POA: Insufficient documentation

## 2012-04-15 DIAGNOSIS — R109 Unspecified abdominal pain: Secondary | ICD-10-CM | POA: Insufficient documentation

## 2012-04-15 DIAGNOSIS — M542 Cervicalgia: Secondary | ICD-10-CM | POA: Insufficient documentation

## 2012-04-18 ENCOUNTER — Telehealth: Payer: Self-pay | Admitting: Gastroenterology

## 2012-04-18 MED ORDER — METHYLPREDNISOLONE 4 MG PO KIT: PACK | ORAL | Status: DC

## 2012-04-18 NOTE — Telephone Encounter (Signed)
Amy please look at [place] and MRI and advise what I need to tell this gentlemen

## 2012-04-18 NOTE — Telephone Encounter (Signed)
Per Mike Gip PA patient needs  Patient advised of the results from [place] and MRI and Amy Esterwood PA .  He  Is asked to follow up with his primary care

## 2012-06-18 ENCOUNTER — Other Ambulatory Visit: Payer: Self-pay

## 2012-08-05 ENCOUNTER — Ambulatory Visit (INDEPENDENT_AMBULATORY_CARE_PROVIDER_SITE_OTHER): Payer: 59 | Admitting: Family Medicine

## 2012-08-05 ENCOUNTER — Encounter: Payer: Self-pay | Admitting: Family Medicine

## 2012-08-05 VITALS — BP 126/70 | HR 106 | Wt 195.0 lb

## 2012-08-05 DIAGNOSIS — R42 Dizziness and giddiness: Secondary | ICD-10-CM

## 2012-08-05 MED ORDER — MECLIZINE HCL 12.5 MG PO TABS: ORAL_TABLET | ORAL | Status: DC

## 2012-08-05 NOTE — Progress Notes (Signed)
  Subjective:    Patient ID: Arthur Ho, male    DOB: Dec 07, 1975, 37 y.o.   MRN: [id number]  HPI Several months ago he had difficulty with dizziness and was seen by ENT. He was told he apparently had nerve damage. The pain went away until one month ago. He describes this as being lightheaded and out of touch as well as slight blurred vision. He does note that when he lies down, the symptoms do lessened but did not go away. No tinnitus or decreased hearing. No relation to head position. He was given meclizine but is taking this very intermittently and when he does we'll take only one or 2.   Review of Systems     Objective:   Physical Exam alert and in no distress. Tympanic membranes and canals are normal. Throat is clear. Tonsils are normal. Neck is supple without adenopathy or thyromegaly. Cardiac exam shows a regular sinus rhythm without murmurs or gallops. Lungs are clear to auscultation. EOMI. Other cranial nerves grossly intact. DTRs normal.       Assessment & Plan:  Vertigo - Plan: meclizine (ANTIVERT) 12.5 MG tablet  I recommended that he take the meclizine regularly for the next week. She will also sign a release form from ENT. He also apparently he was seen by urologist for symptoms that eventually ended up being considered costochondritis. I will have him sign a release form for that as well.

## 2012-08-05 NOTE — Patient Instructions (Signed)
Take the meclizine regularly for the next week and see what that will do. If you're still having trouble, then call me and hopefully by then I'll have the record the

## 2012-10-18 ENCOUNTER — Ambulatory Visit: Payer: 59 | Admitting: Family Medicine

## 2012-12-13 ENCOUNTER — Ambulatory Visit (INDEPENDENT_AMBULATORY_CARE_PROVIDER_SITE_OTHER): Payer: 59 | Admitting: Family Medicine

## 2012-12-13 ENCOUNTER — Encounter: Payer: Self-pay | Admitting: Family Medicine

## 2012-12-13 VITALS — BP 130/70 | HR 97 | Wt 211.0 lb

## 2012-12-13 DIAGNOSIS — R42 Dizziness and giddiness: Secondary | ICD-10-CM

## 2012-12-13 MED ORDER — DIAZEPAM 2 MG PO TABS: ORAL_TABLET | ORAL | Status: DC

## 2012-12-13 NOTE — Progress Notes (Signed)
  Subjective:    Patient ID: Arthur Ho, male    DOB: May 22, 1975, 37 y.o.   MRN: [id number]  HPI He is here for recheck. He continues to have difficulty with intermittent dizziness. The Antivert is not helping. He has had a previous ENT evaluation by Dr. Jenne Campus in [place]. That result was reviewed.   Review of Systems     Objective:   Physical Exam alert and in no distress. EOMI. Cerebellar testing is normal. DTRs normal. Tympanic membranes and canals are normal. Throat is clear. Tonsils are normal. Neck is supple without adenopathy or thyromegaly. Cardiac exam shows a regular sinus rhythm without murmurs or gallops. Lungs are clear to auscultation.        Assessment & Plan:  Vertigo - Plan: diazepam (VALIUM) 2 MG tablet  he will also be set up to see Dr. Jenne Campus for followup concerning his dizziness.

## 2012-12-23 ENCOUNTER — Ambulatory Visit: Payer: Self-pay | Admitting: Unknown Physician Specialty

## 2012-12-27 ENCOUNTER — Encounter: Payer: Self-pay | Admitting: Family Medicine

## 2013-01-05 ENCOUNTER — Ambulatory Visit: Payer: Self-pay | Admitting: Unknown Physician Specialty

## 2013-03-09 ENCOUNTER — Other Ambulatory Visit: Payer: Self-pay

## 2013-04-29 ENCOUNTER — Ambulatory Visit: Payer: Self-pay | Admitting: Physician Assistant

## 2013-04-29 LAB — RAPID STREP-A WITH REFLX: Micro Text Report: POSITIVE

## 2013-12-25 ENCOUNTER — Ambulatory Visit (INDEPENDENT_AMBULATORY_CARE_PROVIDER_SITE_OTHER): Payer: 59 | Admitting: Medical

## 2013-12-25 ENCOUNTER — Encounter: Payer: Self-pay | Admitting: Medical

## 2013-12-25 VITALS — BP 100/70 | HR 64 | Temp 98.3°F | Resp 16 | Wt 212.0 lb

## 2013-12-25 DIAGNOSIS — N489 Disorder of penis, unspecified: Secondary | ICD-10-CM

## 2013-12-25 DIAGNOSIS — N4889 Other specified disorders of penis: Secondary | ICD-10-CM

## 2013-12-25 LAB — POCT URINALYSIS DIPSTICK
Bilirubin, UA: NEGATIVE
Glucose, UA: NEGATIVE
Ketones, UA: NEGATIVE
Leukocytes, UA: NEGATIVE
Nitrite, UA: NEGATIVE
Protein, UA: NEGATIVE
Spec Grav, UA: 1.015
Urobilinogen, UA: NEGATIVE
pH, UA: 5

## 2013-12-25 MED ORDER — FLUCONAZOLE 150 MG PO TABS: ORAL_TABLET | ORAL | Status: DC

## 2013-12-25 NOTE — Progress Notes (Signed)
Subjective: Here for penile discomfort  Accompanied by wife.  He notes last few days have stinging pain, intermittent brief in tip of penis.  Has had this one other time that resolved with Diflucan per urology. He denies rash, no pelvic pain otherwise, no testicular swelling or pain, no penile discharge, no dysuria, no urine odor or cloudy urine, no fever, no back or abdominal pain, no other aggravating or relieving factors.  No other c/o.  ROS as in subjective  Objective: Gen: wd, wn, nad Back: nontender Abdomen: +bs, soft, nontneder, no mass, no organomegaly GU: normal male genitalia, circumcised, no mass, no hernia, no lymphadenopathy He declines rectal exam    Assessment:  Encounter Diagnosis  Name Primary?  . Penile pain Yes   Plan: UA unremarkable.   Begin Diflucan, hydrate well.  If worse, new symptoms, or no improvement in the next 2-3 days, then let us know

## 2014-06-17 ENCOUNTER — Telehealth: Payer: 59 | Admitting: Family

## 2014-06-17 DIAGNOSIS — J069 Acute upper respiratory infection, unspecified: Secondary | ICD-10-CM

## 2014-06-17 MED ORDER — AZITHROMYCIN 250 MG PO TABS: ORAL_TABLET | ORAL | Status: DC

## 2014-06-17 NOTE — Progress Notes (Signed)
We are sorry that you are not feeling well.  Here is how we plan to help!  Based on what you have shared with me it looks like you have sinusitis.  Sinusitis is inflammation and infection in the sinus cavities of the head.  Based on your presentation I believe you most likely have Acute Bacterial sinusitis.  This is an infection caused by bacteria and is treated with antibiotics.  I have prescribed Azithromyin 250 mg: two tables now and then one tablet daily for 4 additonal days  You may use an oral decongestant such as Mucinex D or if you have glaucoma or high blood pressure use plain Mucinex.  Saline nasal sprays help an can sefely be used as often as needed for congestion.  If you develop worsening sinus pain, fever or notice severe headache and vision changes, or if symptoms are not better after completion of antibiotic, please schedule an appointment with a health care provider.  Sinus infections are not as easily transmitted as other respiratory infection, however we still recommend that you avoid close contact with loved ones, especially the very young and elderly.  Remember to wash your hands thoroughly throughout the day as this is the number one way to prevent the spread of infection!  Home Care:  Only take medications as instructed by your medical team.  Complete the entire course of an antibiotic.  Do not take these medications with alcohol.  A steam or ultrasonic humidifier can help congestion.  You can place a towel over your head and breathe in the steam from hot water coming from a faucet.  Avoid close contacts especially the very young and the elderly.  Cover your mouth when you cough or sneeze.  Always remember to wash your hands.  Get Help Right Away If:  You develop worsening fever or sinus pain.  You develop a severe head ache or visual changes.  Your symptoms persist after you have completed your treatment plan.  Make sure you  Understand these  instructions.  Will watch your condition.  Will get help right away if you are not doing well or get worse.  Your e-visit answers were reviewed by a board certified advanced clinical practitioner to complete your personal care plan.  Depending on the condition, your plan could have included both over the counter or prescription medications.  If there is a problem please reply  once you have received a response from your provider.  Your safety is important to us.  If you have drug allergies check your prescription carefully.    You can use MyChart to ask questions about today's visit, request a non-urgent call back, or ask for a work or school excuse.  You will get an e-mail in the next two days asking about your experience.  I hope that your e-visit has been valuable and will speed your recovery. Thank you for using e-visits.        

## 2014-09-25 ENCOUNTER — Emergency Department (HOSPITAL_COMMUNITY)
Admission: EM | Admit: 2014-09-25 | Discharge: 2014-09-25 | Disposition: A | Discharge status: Home / Self Care | Payer: 59 | Attending: Emergency Medicine | Admitting: Emergency Medicine

## 2014-09-25 ENCOUNTER — Encounter (HOSPITAL_COMMUNITY): Payer: Self-pay | Admitting: Emergency Medicine

## 2014-09-25 ENCOUNTER — Emergency Department (HOSPITAL_COMMUNITY): Payer: 59

## 2014-09-25 DIAGNOSIS — Z880 Allergy status to penicillin: Secondary | ICD-10-CM | POA: Diagnosis not present

## 2014-09-25 DIAGNOSIS — Z87442 Personal history of urinary calculi: Secondary | ICD-10-CM | POA: Insufficient documentation

## 2014-09-25 DIAGNOSIS — R05 Cough: Secondary | ICD-10-CM | POA: Diagnosis not present

## 2014-09-25 DIAGNOSIS — Z8639 Personal history of other endocrine, nutritional and metabolic disease: Secondary | ICD-10-CM | POA: Diagnosis not present

## 2014-09-25 DIAGNOSIS — K219 Gastro-esophageal reflux disease without esophagitis: Secondary | ICD-10-CM | POA: Insufficient documentation

## 2014-09-25 DIAGNOSIS — R079 Chest pain, unspecified: Secondary | ICD-10-CM | POA: Diagnosis present

## 2014-09-25 DIAGNOSIS — Z87891 Personal history of nicotine dependence: Secondary | ICD-10-CM | POA: Insufficient documentation

## 2014-09-25 DIAGNOSIS — R059 Cough, unspecified: Secondary | ICD-10-CM

## 2014-09-25 LAB — BASIC METABOLIC PANEL
Anion gap: 10 (ref 5–15)
BUN: 11 mg/dL (ref 6–20)
CO2: 23 mmol/L (ref 22–32)
Calcium: 9.6 mg/dL (ref 8.9–10.3)
Chloride: 103 mmol/L (ref 101–111)
Creatinine, Ser: 1.02 mg/dL (ref 0.61–1.24)
GFR calc Af Amer: >60 mL/min (ref >60)
GFR calc non Af Amer: >60 mL/min (ref >60)
Glucose, Bld: 98 mg/dL (ref 65–99)
Potassium: 3.4 mmol/L — ABNORMAL LOW (ref 3.5–5.1)
Sodium: 136 mmol/L (ref 135–145)

## 2014-09-25 LAB — CBC
HCT: 44.7 % (ref 39.0–52.0)
Hemoglobin: 15.6 g/dL (ref 13.0–17.0)
MCH: 30.8 pg (ref 26.0–34.0)
MCHC: 34.9 g/dL (ref 30.0–36.0)
MCV: 88.2 fL (ref 78.0–100.0)
Platelets: 305 10*3/uL (ref 150–400)
RBC: 5.07 MIL/uL (ref 4.22–5.81)
RDW: 11.8 % (ref 11.5–15.5)
WBC: 10.1 10*3/uL (ref 4.0–10.5)

## 2014-09-25 LAB — I-STAT TROPONIN, ED: Troponin i, poc: 0 ng/mL (ref 0.00–0.08)

## 2014-09-25 MED ORDER — GI COCKTAIL ~~LOC~~
30.0000 mL | Freq: Once | ORAL | Status: AC
  Administered 2014-09-25: 30 mL via ORAL
  Filled 2014-09-25: qty 30

## 2014-09-25 MED ORDER — ONDANSETRON 4 MG PO TBDP
4.0000 mg | ORAL_TABLET | Freq: Three times a day (TID) | ORAL | Status: DC | PRN
Start: 2014-09-25 — End: 2014-10-05

## 2014-09-25 MED ORDER — ACETAMINOPHEN 325 MG PO TABS
650.0000 mg | ORAL_TABLET | Freq: Once | ORAL | Status: AC
  Administered 2014-09-25: 650 mg via ORAL
  Filled 2014-09-25: qty 2

## 2014-09-25 MED ORDER — BENZONATATE 100 MG PO CAPS: 100.0000 mg | ORAL_CAPSULE | Freq: Three times a day (TID) | ORAL | Status: DC

## 2014-09-25 MED ORDER — OMEPRAZOLE 20 MG PO CPDR: 20.0000 mg | DELAYED_RELEASE_CAPSULE | Freq: Every day | ORAL | Status: DC

## 2014-09-25 NOTE — Discharge Instructions (Signed)
Chest Pain (Nonspecific) °It is often hard to give a specific diagnosis for the cause of chest pain. There is always a chance that your pain could be related to something serious, such as a heart attack or a blood clot in the lungs. You need to follow up with your health care provider for further evaluation. °CAUSES  °· Heartburn. °· Pneumonia or bronchitis. °· Anxiety or stress. °· Inflammation around your heart (pericarditis) or lung (pleuritis or pleurisy). °· A blood clot in the lung. °· A collapsed lung (pneumothorax). It can develop suddenly on its own (spontaneous pneumothorax) or from trauma to the chest. °· Shingles infection (herpes zoster virus). °The chest wall is composed of bones, muscles, and cartilage. Any of these can be the source of the pain. °· The bones can be bruised by injury. °· The muscles or cartilage can be strained by coughing or overwork. °· The cartilage can be affected by inflammation and become sore (costochondritis). °DIAGNOSIS  °Lab tests or other studies may be needed to find the cause of your pain. Your health care provider may have you take a test called an ambulatory electrocardiogram (ECG). An ECG records your heartbeat patterns over a 24-hour period. You may also have other tests, such as: °· Transthoracic echocardiogram (TTE). During echocardiography, sound waves are used to evaluate how blood flows through your heart. °· Transesophageal echocardiogram (TEE). °· Cardiac monitoring. This allows your health care provider to monitor your heart rate and rhythm in real time. °· Holter monitor. This is a portable device that records your heartbeat and can help diagnose heart arrhythmias. It allows your health care provider to track your heart activity for several days, if needed. °· Stress tests by exercise or by giving medicine that makes the heart beat faster. °TREATMENT  °· Treatment depends on what may be causing your chest pain. Treatment may include: °· Acid blockers for  heartburn. °· Anti-inflammatory medicine. °· Pain medicine for inflammatory conditions. °· Antibiotics if an infection is present. °· You may be advised to change lifestyle habits. This includes stopping smoking and avoiding alcohol, caffeine, and chocolate. °· You may be advised to keep your head raised (elevated) when sleeping. This reduces the chance of acid going backward from your stomach into your esophagus. °Most of the time, nonspecific chest pain will improve within 2-3 days with rest and mild pain medicine.  °HOME CARE INSTRUCTIONS  °· If antibiotics were prescribed, take them as directed. Finish them even if you start to feel better. °· For the next few days, avoid physical activities that bring on chest pain. Continue physical activities as directed. °· Do not use any tobacco products, including cigarettes, chewing tobacco, or electronic cigarettes. °· Avoid drinking alcohol. °· Only take medicine as directed by your health care provider. °· Follow your health care provider's suggestions for further testing if your chest pain does not go away. °· Keep any follow-up appointments you made. If you do not go to an appointment, you could develop lasting (chronic) problems with pain. If there is any problem keeping an appointment, call to reschedule. °SEEK MEDICAL CARE IF:  °· Your chest pain does not go away, even after treatment. °· You have a rash with blisters on your chest. °· You have a fever. °SEEK IMMEDIATE MEDICAL CARE IF:  °· You have increased chest pain or pain that spreads to your arm, neck, jaw, back, or abdomen. °· You have shortness of breath. °· You have an increasing cough, or you cough   up blood.  You have severe back or abdominal pain.  You feel nauseous or vomit.  You have severe weakness.  You faint.  You have chills. This is an emergency. Do not wait to see if the pain will go away. Get medical help at once. Call your local emergency services (911 in [place] Do not drive  yourself to the hospital. MAKE SURE YOU:   Understand these instructions.  Will watch your condition.  Will get help right away if you are not doing well or get worse. Document Released: 01/28/2005 Document Revised: 04/25/2013 Document Reviewed: 11/24/2007 [facility] Patient Information 2015 [place], [place]. This information is not intended to replace advice given to you by your health care provider. Make sure you discuss any questions you have with your health care provider. Gastroesophageal Reflux Disease, Adult Gastroesophageal reflux disease (GERD) happens when acid from your stomach flows up into the esophagus. When acid comes in contact with the esophagus, the acid causes soreness (inflammation) in the esophagus. Over time, GERD may create small holes (ulcers) in the lining of the esophagus. CAUSES   Increased body weight. This puts pressure on the stomach, making acid rise from the stomach into the esophagus.  Smoking. This increases acid production in the stomach.  Drinking alcohol. This causes decreased pressure in the lower esophageal sphincter (valve or ring of muscle between the esophagus and stomach), allowing acid from the stomach into the esophagus.  Late evening meals and a full stomach. This increases pressure and acid production in the stomach.  A malformed lower esophageal sphincter. Sometimes, no cause is found. SYMPTOMS   Burning pain in the lower part of the mid-chest behind the breastbone and in the mid-stomach area. This may occur twice a week or more often.  Trouble swallowing.  Sore throat.  Dry cough.  Asthma-like symptoms including chest tightness, shortness of breath, or wheezing. DIAGNOSIS  Your caregiver may be able to diagnose GERD based on your symptoms. In some cases, X-rays and other tests may be done to check for complications or to check the condition of your stomach and esophagus. TREATMENT  Your caregiver may recommend over-the-counter or  prescription medicines to help decrease acid production. Ask your caregiver before starting or adding any new medicines.  HOME CARE INSTRUCTIONS   Change the factors that you can control. Ask your caregiver for guidance concerning weight loss, quitting smoking, and alcohol consumption.  Avoid foods and drinks that make your symptoms worse, such as:  Caffeine or alcoholic drinks.  Chocolate.  Peppermint or mint flavorings.  Garlic and onions.  Spicy foods.  Citrus fruits, such as oranges, lemons, or limes.  Tomato-based foods such as sauce, chili, salsa, and pizza.  Fried and fatty foods.  Avoid lying down for the 3 hours prior to your bedtime or prior to taking a nap.  Eat small, frequent meals instead of large meals.  Wear loose-fitting clothing. Do not wear anything tight around your waist that causes pressure on your stomach.  Raise the head of your bed 6 to 8 inches with wood blocks to help you sleep. Extra pillows will not help.  Only take over-the-counter or prescription medicines for pain, discomfort, or fever as directed by your caregiver.  Do not take aspirin, ibuprofen, or other nonsteroidal anti-inflammatory drugs (NSAIDs). SEEK IMMEDIATE MEDICAL CARE IF:   You have pain in your arms, neck, jaw, teeth, or back.  Your pain increases or changes in intensity or duration.  You develop nausea, vomiting, or sweating (diaphoresis).  You develop shortness of breath, or you faint.  Your vomit is green, yellow, black, or looks like coffee grounds or blood.  Your stool is red, bloody, or black. These symptoms could be signs of other problems, such as heart disease, gastric bleeding, or esophageal bleeding. MAKE SURE YOU:   Understand these instructions.  Will watch your condition.  Will get help right away if you are not doing well or get worse. Document Released: 01/28/2005 Document Revised: 07/13/2011 Document Reviewed: 11/07/2010 [facility] Patient  Information 2015 [place]. This information is not intended to replace advice given to you by your health care provider. Make sure you discuss any questions you have with your health care provider. Cough, Adult  A cough is a reflex that helps clear your throat and airways. It can help heal the body or may be a reaction to an irritated airway. A cough may only last 2 or 3 weeks (acute) or may last more than 8 weeks (chronic).  CAUSES Acute cough:  Viral or bacterial infections. Chronic cough:  Infections.  Allergies.  Asthma.  Post-nasal drip.  Smoking.  Heartburn or acid reflux.  Some medicines.  Chronic lung problems (COPD).  Cancer. SYMPTOMS   Cough.  Fever.  Chest pain.  Increased breathing rate.  High-pitched whistling sound when breathing (wheezing).  Colored mucus that you cough up (sputum). TREATMENT   A bacterial cough may be treated with antibiotic medicine.  A viral cough must run its course and will not respond to antibiotics.  Your caregiver may recommend other treatments if you have a chronic cough. HOME CARE INSTRUCTIONS   Only take over-the-counter or prescription medicines for pain, discomfort, or fever as directed by your caregiver. Use cough suppressants only as directed by your caregiver.  Use a cold steam vaporizer or humidifier in your bedroom or home to help loosen secretions.  Sleep in a semi-upright position if your cough is worse at night.  Rest as needed.  Stop smoking if you smoke. SEEK IMMEDIATE MEDICAL CARE IF:   You have pus in your sputum.  Your cough starts to worsen.  You cannot control your cough with suppressants and are losing sleep.  You begin coughing up blood.  You have difficulty breathing.  You develop pain which is getting worse or is uncontrolled with medicine.  You have a fever. MAKE SURE YOU:   Understand these instructions.  Will watch your condition.  Will get help right away if you are  not doing well or get worse. Document Released: 10/17/2010 Document Revised: 07/13/2011 Document Reviewed: 10/17/2010 [facility] Patient Information 2015 [place]. This information is not intended to replace advice given to you by your health care provider. Make sure you discuss any questions you have with your health care provider.

## 2014-09-25 NOTE — ED Provider Notes (Signed)
CSN: [id number]     Arrival date & time 09/25/14  0330 History   First MD Initiated Contact with Patient 09/25/14 [phone]     Chief Complaint  Patient presents with  . Chest Pain   Arthur Ho is a 39 y.o. male with a history of GERD who presents to the ED complaining of substernal chest pain intermittently for the past 4 days. The patient also reports associated slight cough, body aches and chills for the past several days. The patient reports waking up at 2 am feeling "jittery" and then began noticing substernal nonradiating chest pain that he describes as a pressure. He reports his pain was 5 out of 10. He currently denies any chest pain. He does report feeling very anxious when this began and is still currently feeling very anxious. He complains of some slight nausea as well as lots of burping. He reports taking ranitidine this morning for some treatment. The patient is unable to identify any aggravating or alleviating factors of his chest pain. He denies worsening chest pain on exertion. He denies having any shortness of breath with this chest pain. He reports a slight nonproductive cough and reports just feeling ill. The patient denies personal or close family history of MI. The patient denies personal or family history of DVTs or PEs. The patient denies personal or family history of blood clotting disorders such as factor V Leiden, protein C or S deficiency. The patient denies fevers, leg pain, leg swelling, smoking, recent surgeries, recent long travel, history of hypertension, history of hyperlipidemia, shortness of breath, palpitations, vomiting, abdominal pain, diarrhea, lightheadedness, dizziness, or rashes.  (Consider location/radiation/quality/duration/timing/severity/associated sxs/prior Treatment) HPI  Past Medical History  Diagnosis Date  . GERD (gastroesophageal reflux disease)   . Calcium nephrolithiasis   . Thyroid disease    Past Surgical History  Procedure Laterality Date   . Hernia repair      R inguinal  . Kidney stone surgery      basket retrieval x several  . Lithotripsy      x2   Family History  Problem Relation Age of Onset  . Allergies Maternal Grandfather   . Vision loss Maternal Grandfather   . Emphysema Maternal Grandfather   . Nephrolithiasis Maternal Grandfather   . Colon cancer Paternal Grandfather    History  Substance Use Topics  . Smoking status: Former Smoker    Quit date: 05/04/2008  . Smokeless tobacco: Never Used  . Alcohol Use: No    Review of Systems  Constitutional: Positive for chills and fatigue. Negative for fever.  HENT: Negative for congestion, ear pain and sore throat.   Eyes: Negative for pain and visual disturbance.  Respiratory: Positive for cough. Negative for chest tightness, shortness of breath and wheezing.   Cardiovascular: Positive for chest pain. Negative for palpitations and leg swelling.  Gastrointestinal: Positive for nausea. Negative for vomiting, abdominal pain and diarrhea.  Genitourinary: Negative for dysuria.  Musculoskeletal: Positive for arthralgias. Negative for back pain and neck pain.  Skin: Negative for rash.  Neurological: Negative for weakness, light-headedness, numbness and headaches.      Allergies  Penicillins  Home Medications   Prior to Admission medications   Medication Sig Start Date End Date Taking? Authorizing Provider  azithromycin (ZITHROMAX) 250 MG tablet Take 500 mg once, then 250 mg for four days Patient not taking: Reported on 09/25/2014 06/17/14   Junie Spencer, FNP  benzonatate (TESSALON) 100 MG capsule Take 1 capsule (100 mg total) by  mouth every 8 (eight) hours. 09/25/14   Everlene FarrierWilliam , PA-C  fluconazole (DIFLUCAN) 150 MG tablet 1 tablet once, may repeat in 1 week Patient not taking: Reported on 09/25/2014 12/25/13   Kermit Baloavid S Tysinger, PA-C  omeprazole (PRILOSEC) 20 MG capsule Take 1 capsule (20 mg total) by mouth daily. 09/25/14   Everlene FarrierWilliam , PA-C   ondansetron (ZOFRAN ODT) 4 MG disintegrating tablet Take 1 tablet (4 mg total) by mouth every 8 (eight) hours as needed for nausea or vomiting. 09/25/14   Everlene FarrierWilliam , PA-C   BP 123/70 mmHg  Pulse 62  Temp(Src) 99.5 F (37.5 C) (Oral)  Resp 20  Ht 5\' 9"  (1.753 m)  Wt 205 lb (92.987 kg)  BMI 30.26 kg/m2  SpO2 98% Physical Exam  Constitutional: He is oriented to person, place, and time. He appears well-developed and well-nourished. No distress.  Nontoxic appearing.  HENT:  Head: Normocephalic and atraumatic.  Right Ear: External ear normal.  Left Ear: External ear normal.  Mouth/Throat: Oropharynx is clear and moist. No oropharyngeal exudate.  Eyes: Conjunctivae are normal. Pupils are equal, round, and reactive to light. Right eye exhibits no discharge. Left eye exhibits no discharge.  Neck: Normal range of motion. Neck supple. No JVD present. No tracheal deviation present.  Cardiovascular: Normal rate, regular rhythm, normal heart sounds and intact distal pulses.  Exam reveals no gallop and no friction rub.   No murmur heard. HR 76. Bilateral radial, posterior tibialis and dorsalis pedis pulses are intact.    Pulmonary/Chest: Effort normal and breath sounds normal. No respiratory distress. He has no wheezes. He has no rales. He exhibits no tenderness.  Lungs are clear to auscultation bilaterally. No chest tenderness.  Abdominal: Soft. He exhibits no distension. There is no tenderness. There is no guarding.  Abdomen is soft and nontender to palpation.  Musculoskeletal: He exhibits no edema or tenderness.  No lower extremity edema or tenderness.  Lymphadenopathy:    He has no cervical adenopathy.  Neurological: He is alert and oriented to person, place, and time. Coordination normal.  Skin: Skin is warm and dry. No rash noted. He is not diaphoretic. No erythema. No pallor.  Psychiatric: He has a normal mood and affect. His behavior is normal.  Nursing note and vitals  reviewed.   ED Course  Procedures (including critical care time) Labs Review Labs Reviewed  BASIC METABOLIC PANEL - Abnormal; Notable for the following:    Potassium 3.4 (*)    All other components within normal limits  CBC  I-STAT TROPOININ, ED    Imaging Review Dg Chest 2 View  09/25/2014   CLINICAL DATA:  Intermittent chest pain 40 toe.  Worsening today.  EXAM: CHEST  2 VIEW  COMPARISON:  None.  FINDINGS: The heart size and mediastinal contours are within normal limits. Both lungs are clear. The visualized skeletal structures are unremarkable.  IMPRESSION: No active cardiopulmonary disease.   Electronically Signed   By: Charlett NoseKevin  Dover M.D.   On: 09/25/2014 08:58     EKG Interpretation   Date/Time:  Tuesday Sep 25 2014 03:39:33 EDT Ventricular Rate:  79 PR Interval:  137 QRS Duration: 79 QT Interval:  368 QTC Calculation: 422 R Axis:   27 Text Interpretation:  Sinus rhythm Probable left atrial enlargement  Confirmed by OTTER  MD, OLGA ([id number]) on 09/25/2014 4:57:11 AM      Filed Vitals:   09/25/14 [id number] 09/25/14 0904 09/25/14 0915 09/25/14 0932  BP: 109/69 109/69 113/73 123/70  Pulse:  74 61 57 62  Temp:      TempSrc:      Resp: Height:      Weight:      SpO2: 99% 97% 96% 98%     MDM   Meds given in ED:  Medications  gi cocktail (Maalox,Lidocaine,Donnatal) (30 mLs Oral Given 09/25/14 0750)  acetaminophen (TYLENOL) tablet 650 mg (650 mg Oral Given 09/25/14 0750)    Discharge Medication List as of 09/25/2014  9:25 AM    START taking these medications   Details  benzonatate (TESSALON) 100 MG capsule Take 1 capsule (100 mg total) by mouth every 8 (eight) hours., Starting 09/25/2014, Until Discontinued, Print    omeprazole (PRILOSEC) 20 MG capsule Take 1 capsule (20 mg total) by mouth daily., Starting 09/25/2014, Until Discontinued, Print    ondansetron (ZOFRAN ODT) 4 MG disintegrating tablet Take 1 tablet (4 mg total) by mouth every 8 (eight) hours as  needed for nausea or vomiting., Starting 09/25/2014, Until Discontinued, Print        Final diagnoses:  Chest pain, unspecified chest pain type  Cough  Gastroesophageal reflux disease, esophagitis presence not specified     Patient is to be discharged with recommendation to follow up with PCP in regards to today's hospital visit. Chest pain is not likely of cardiac or pulmonary etiology d/t presentation, perc negative, VSS, no tracheal deviation, no JVD or new murmur, RRR, breath sounds equal bilaterally, HEART Score 1, EKG without acute abnormalities, negative troponin, and negative CXR. The patient is afebrile and nontoxic appearing. His lungs are clear to auscultation bilaterally. He is chest pain-free at discharge. Pt has been advised start a PPI and return to the ED if CP becomes exertional, associated with diaphoresis or nausea, radiates to left jaw/arm, worsens or becomes concerning in any way. Pt appears reliable for follow up and is agreeable to discharge.   This patient was discussed with Dr. Patria Mane who agrees with assessment and plan.     Everlene Farrier, PA-C 09/25/14 1640  Azalia Bilis, MD 09/26/14 [phone]

## 2014-09-25 NOTE — ED Notes (Signed)
Started having intermittant CP 4 days ago that comes and goes.  States it got worse tonight and I started being anxious with it.  When ems arrived pt was cool and pale with pain rated at 6/10.  Rating pain at a 5/10 at this time described as pressure in center of the chest.  Received ASA 324mg  enroute.  CBG-102.

## 2014-09-27 ENCOUNTER — Ambulatory Visit (INDEPENDENT_AMBULATORY_CARE_PROVIDER_SITE_OTHER): Payer: 59 | Admitting: Medical

## 2014-09-27 ENCOUNTER — Encounter: Payer: Self-pay | Admitting: Medical

## 2014-09-27 VITALS — BP 120/70 | HR 76 | Temp 98.0°F | Resp 15 | Wt 200.0 lb

## 2014-09-27 DIAGNOSIS — R079 Chest pain, unspecified: Secondary | ICD-10-CM | POA: Diagnosis not present

## 2014-09-27 DIAGNOSIS — R0789 Other chest pain: Secondary | ICD-10-CM | POA: Diagnosis not present

## 2014-09-27 DIAGNOSIS — R1013 Epigastric pain: Secondary | ICD-10-CM

## 2014-09-27 MED ORDER — DEXLANSOPRAZOLE 60 MG PO CPDR: 60.0000 mg | DELAYED_RELEASE_CAPSULE | Freq: Every day | ORAL | Status: DC

## 2014-09-27 NOTE — Progress Notes (Signed)
   Subjective:   Arthur Ho is a 39 y.o. male presenting on 09/27/2014 with hospital follow up  Was seen in the ED recently for chest pain.   He notes the 4-5 days leading up the ED visit had been having chest heaviness.  He had also been pressure washing his house for 6 hours the day before.    He had eaten a meatball sub the night of the ED visit, awoke 2am with severe chest pain and heaviness.  Called 911, was taken to the ED.  Had EKG, CXR, labs, given omeprazole 20mg  and advised to f/u with PCP.  Dad had dysrhythmia and was heavy smoker, but no other family hx/o heart disease . No hx/o cardiac risk factors, but was former smoker.  Smoked 1/3ppd from 39yo until 2006.  No other aggravating or relieving factors. No other complaint.  Review of Systems ROS as in subjective    Objective:    Filed Vitals:   09/27/14 1155  BP: 120/70  Pulse: 76  Temp: 98 F (36.7 C)  Resp: 15    General appearance: alert, no distress, WD/WN HEENT: normocephalic, sclerae anicteric, TMs pearly, nares patent, no discharge or erythema, pharynx normal Oral cavity: MMM, no lesions Neck: supple, no lymphadenopathy, no thyromegaly, no masses Heart: RRR, normal S1, S2, no murmurs Lungs: CTA bilaterally, no wheezes, rhonchi, or rales Abdomen: +bs, soft, non tender, non distended, no mass, no hepatomegaly, no splenomegaly Chest wall: mild tenderness left chest wall, otherwise nontender, normal I:E Pulses: 2+ symmetric, upper and lower extremities, normal cap refill Ext: no edema      Assessment: Encounter Diagnoses  Name Primary?  . Chest pain, unspecified chest pain type Yes  . Abdominal pain, epigastric   . Chest wall pain      Plan: Reviewed ED report, CXR, EKG, labs.  Given the recent 6 hours of pressure washing and what appears to have been gastritis, both are likely triggers of the recent chest pain.  Change to Dexilant samples for 10 days, then go back to Omeprazole and finish this out.   Avoid GERD triggers, can use Tylenol for chest wall pain.   Recheck 2wk, sooner prn.   Alycia RossettiRyan was seen today for hospital follow up.  Diagnoses and all orders for this visit:  Chest pain, unspecified chest pain type  Abdominal pain, epigastric  Chest wall pain  Other orders -     dexlansoprazole (DEXILANT) 60 MG capsule; Take 1 capsule (60 mg total) by mouth daily.   Return 2 wk.

## 2014-10-05 ENCOUNTER — Ambulatory Visit (INDEPENDENT_AMBULATORY_CARE_PROVIDER_SITE_OTHER): Payer: 59 | Admitting: Family Medicine

## 2014-10-05 ENCOUNTER — Encounter: Payer: Self-pay | Admitting: Family Medicine

## 2014-10-05 VITALS — BP 126/80 | HR 73 | Wt 197.0 lb

## 2014-10-05 DIAGNOSIS — R1013 Epigastric pain: Secondary | ICD-10-CM

## 2014-10-05 MED ORDER — HYOSCYAMINE SULFATE ER 0.375 MG PO TB12: 0.3750 mg | ORAL_TABLET | Freq: Two times a day (BID) | ORAL | Status: DC

## 2014-10-05 NOTE — Patient Instructions (Signed)
Take the last DEXA lot and then switch to 40 mg of the Prilosec. Take the Levbid twice per day for the next week and let me know.

## 2014-10-05 NOTE — Progress Notes (Signed)
   Subjective:    Patient ID: Pauline Goodyan W Bisono, male    DOB: 1975/10/25, 39 y.o.   MRN: [id number]  HPI He is here for a recheck. He is now about finished Exelon and states that he thinks his stomach is slightly better but still having some difficulty with cramping. He cannot relate this specifically to any foods or stresses. There's been no nausea, vomiting or diarrhea. He is quite concerned over this.   Review of Systems     Objective:   Physical Exam Alert and in no distress. Cardiac exam shows regular rhythm without murmurs or gallops. Lungs are clear to auscultation. Abdominal exam shows decreased bowel sounds without masses or tenderness. The ER record was reviewed.       Assessment & Plan:  Abdominal pain, epigastric - Plan: hyoscyamine (LEVBID) 0.375 MG 12 hr tablet Explained that we have ruled out cardiac as well as lung issues and everything does point to his stomach. Explained that I did not think this was anything of any danger. If he does not respond to Levbid, I will refer to GI for further evaluation.

## 2014-10-10 ENCOUNTER — Telehealth: Payer: Self-pay | Admitting: Family Medicine

## 2014-10-10 ENCOUNTER — Encounter (HOSPITAL_COMMUNITY): Payer: Self-pay | Admitting: *Deleted

## 2014-10-10 ENCOUNTER — Emergency Department (HOSPITAL_COMMUNITY)
Admission: EM | Admit: 2014-10-10 | Discharge: 2014-10-10 | Disposition: A | Discharge status: Home / Self Care | Payer: 59 | Attending: Emergency Medicine | Admitting: Emergency Medicine

## 2014-10-10 ENCOUNTER — Emergency Department (HOSPITAL_COMMUNITY): Payer: 59

## 2014-10-10 DIAGNOSIS — K219 Gastro-esophageal reflux disease without esophagitis: Secondary | ICD-10-CM | POA: Diagnosis not present

## 2014-10-10 DIAGNOSIS — R079 Chest pain, unspecified: Secondary | ICD-10-CM | POA: Diagnosis present

## 2014-10-10 DIAGNOSIS — Z87891 Personal history of nicotine dependence: Secondary | ICD-10-CM | POA: Insufficient documentation

## 2014-10-10 DIAGNOSIS — R0602 Shortness of breath: Secondary | ICD-10-CM | POA: Diagnosis not present

## 2014-10-10 DIAGNOSIS — Z87442 Personal history of urinary calculi: Secondary | ICD-10-CM | POA: Diagnosis not present

## 2014-10-10 DIAGNOSIS — F419 Anxiety disorder, unspecified: Secondary | ICD-10-CM | POA: Insufficient documentation

## 2014-10-10 DIAGNOSIS — Z880 Allergy status to penicillin: Secondary | ICD-10-CM | POA: Diagnosis not present

## 2014-10-10 LAB — CBC
HCT: 46.7 % (ref 39.0–52.0)
Hemoglobin: 16.4 g/dL (ref 13.0–17.0)
MCH: 31.2 pg (ref 26.0–34.0)
MCHC: 35.1 g/dL (ref 30.0–36.0)
MCV: 89 fL (ref 78.0–100.0)
Platelets: 314 10*3/uL (ref 150–400)
RBC: 5.25 MIL/uL (ref 4.22–5.81)
RDW: 12.1 % (ref 11.5–15.5)
WBC: 6.5 10*3/uL (ref 4.0–10.5)

## 2014-10-10 LAB — COMPREHENSIVE METABOLIC PANEL
ALT: 47 U/L (ref 17–63)
AST: 28 U/L (ref 15–41)
Albumin: 4.5 g/dL (ref 3.5–5.0)
Alkaline Phosphatase: 44 U/L (ref 38–126)
Anion gap: 12 (ref 5–15)
BUN: <5 mg/dL — ABNORMAL LOW (ref 6–20)
CO2: 21 mmol/L — ABNORMAL LOW (ref 22–32)
Calcium: 9.7 mg/dL (ref 8.9–10.3)
Chloride: 105 mmol/L (ref 101–111)
Creatinine, Ser: 1.02 mg/dL (ref 0.61–1.24)
GFR calc Af Amer: >60 mL/min (ref >60)
GFR calc non Af Amer: >60 mL/min (ref >60)
Glucose, Bld: 78 mg/dL (ref 65–99)
Potassium: 3.8 mmol/L (ref 3.5–5.1)
Sodium: 138 mmol/L (ref 135–145)
Total Bilirubin: 1.2 mg/dL (ref 0.3–1.2)
Total Protein: 7.1 g/dL (ref 6.5–8.1)

## 2014-10-10 LAB — TSH: TSH: 0.834 u[IU]/mL (ref 0.350–4.500)

## 2014-10-10 LAB — I-STAT TROPONIN, ED: Troponin i, poc: 0 ng/mL (ref 0.00–0.08)

## 2014-10-10 LAB — D-DIMER, QUANTITATIVE: D-Dimer, Quant: <0.27 ug/mL-FEU (ref 0.00–0.48)

## 2014-10-10 MED ORDER — SUCRALFATE 1 G PO TABS: 1.0000 g | ORAL_TABLET | Freq: Three times a day (TID) | ORAL | Status: DC

## 2014-10-10 MED ORDER — ALPRAZOLAM 0.5 MG PO TABS
0.5000 mg | ORAL_TABLET | Freq: Once | ORAL | Status: AC
  Administered 2014-10-10: 0.5 mg via ORAL
  Filled 2014-10-10: qty 1

## 2014-10-10 MED ORDER — ALBUTEROL SULFATE (2.5 MG/3ML) 0.083% IN NEBU
5.0000 mg | INHALATION_SOLUTION | Freq: Once | RESPIRATORY_TRACT | Status: AC
  Administered 2014-10-10: 5 mg via RESPIRATORY_TRACT
  Filled 2014-10-10: qty 6

## 2014-10-10 MED ORDER — OMEPRAZOLE 20 MG PO CPDR: 20.0000 mg | DELAYED_RELEASE_CAPSULE | Freq: Two times a day (BID) | ORAL | Status: DC

## 2014-10-10 MED ORDER — ALPRAZOLAM 0.25 MG PO TABS: 0.2500 mg | ORAL_TABLET | Freq: Every evening | ORAL | Status: DC | PRN

## 2014-10-10 NOTE — Telephone Encounter (Signed)
Make the referral 

## 2014-10-10 NOTE — ED Notes (Signed)
Pt reports has been seen twice here in last two weeks for chest pressure. Diagnosed with GERD, given medications. Saw PCP, medications adjusted, but still not having relief. Also reports shortness of breath.

## 2014-10-10 NOTE — Discharge Instructions (Signed)
Panic Attacks °Panic attacks are sudden, short feelings of great fear or discomfort. You may have them for no reason when you are relaxed, when you are uneasy (anxious), or when you are sleeping.  °HOME CARE °· Take all your medicines as told. °· Check with your doctor before starting new medicines. °· Keep all doctor visits. °GET HELP IF: °· You are not able to take your medicines as told. °· Your symptoms do not get better. °· Your symptoms get worse. °GET HELP RIGHT AWAY IF: °· Your attacks seem different than your normal attacks. °· You have thoughts about hurting yourself or others. °· You take panic attack medicine and you have a side effect. °MAKE SURE YOU: °· Understand these instructions. °· Will watch your condition. °· Will get help right away if you are not doing well or get worse. °Document Released: 05/23/2010 Document Revised: 02/08/2013 Document Reviewed: 12/02/2012 °ExitCare® Patient Information ©2015 [employer]. This information is not intended to replace advice given to you by your health care provider. Make sure you discuss any questions you have with your health care provider. ° °

## 2014-10-10 NOTE — Telephone Encounter (Signed)
Wife states pt is not doing good, today not feeling well at all, feels nauseous, lost weight, no energy, wants referral to GI ASAP

## 2014-10-10 NOTE — ED Provider Notes (Signed)
CSN: [id number]     Arrival date & time 10/10/14  1530 History   First MD Initiated Contact with Patient 10/10/14 1853     Chief Complaint  Patient presents with  . Chest Pain      HPI Pt reports has been seen twice here in last two weeks for chest pressure. Diagnosed with GERD, given medications. Saw PCP, medications adjusted, but still not having relief. Also reports shortness of breath. Past Medical History  Diagnosis Date  . GERD (gastroesophageal reflux disease)   . Calcium nephrolithiasis    Past Surgical History  Procedure Laterality Date  . Hernia repair      R inguinal  . Kidney stone surgery      basket retrieval x several  . Lithotripsy      x2   Family History  Problem Relation Age of Onset  . Allergies Maternal Grandfather   . Vision loss Maternal Grandfather   . Emphysema Maternal Grandfather   . Nephrolithiasis Maternal Grandfather   . Colon cancer Paternal Grandfather    History  Substance Use Topics  . Smoking status: Former Smoker    Quit date: 05/04/2008  . Smokeless tobacco: Never Used  . Alcohol Use: No    Review of Systems  All other systems reviewed and are negative  Allergies  Penicillins  Home Medications   Prior to Admission medications   Medication Sig Start Date End Date Taking? Authorizing Provider  ALPRAZolam (XANAX) 0.25 MG tablet Take 1 tablet (0.25 mg total) by mouth at bedtime as needed for anxiety. 10/10/14   Nelva Nay, MD  omeprazole (PRILOSEC) 20 MG capsule Take 1 capsule (20 mg total) by mouth 2 (two) times daily before a meal. 10/10/14   Nelva Nay, MD  sucralfate (CARAFATE) 1 G tablet Take 1 tablet (1 g total) by mouth 4 (four) times daily -  with meals and at bedtime. 10/10/14   Nelva Nay, MD   BP 122/64 mmHg  Pulse 85  Temp(Src) 98.4 F (36.9 C) (Oral)  Resp 20  SpO2 98% Physical Exam Physical Exam  Nursing note and vitals reviewed. Constitutional: He is oriented to person, place, and time. He appears  well-developed and well-nourished. No distress.  HENT:  Head: Normocephalic and atraumatic.  Eyes: Pupils are equal, round, and reactive to light.  Neck: Normal range of motion.  Cardiovascular: Normal rate and intact distal pulses.   Pulmonary/Chest: No respiratory distress.  Abdominal: Normal appearance. He exhibits no distension.  Musculoskeletal: Normal range of motion.  Neurological: He is alert and oriented to person, place, and time. No cranial nerve deficit.  Skin: Skin is warm and dry. No rash noted.  Psychiatric: He has a normal mood and affect. His behavior is normal.   ED Course  Procedures (including critical care time) Labs Review Labs Reviewed  COMPREHENSIVE METABOLIC PANEL - Abnormal; Notable for the following:    CO2 21 (*)    BUN <5 (*)    All other components within normal limits  CBC  D-DIMER, QUANTITATIVE (NOT AT [facility])  TSH  I-STAT TROPOININ, ED    Imaging Review Dg Chest 2 View  10/10/2014   CLINICAL DATA:  Chest pressure and shortness of breath.  EXAM: CHEST - 2 VIEW  COMPARISON:  09/25/2014  FINDINGS: The heart size and mediastinal contours are within normal limits. There is no evidence of pulmonary edema, consolidation, pneumothorax, nodule or pleural fluid. The visualized skeletal structures are unremarkable.  IMPRESSION: No active disease.   Electronically  Signed   By: Irish LackGlenn  Yamagata M.D.   On: 10/10/2014 16:11     EKG Interpretation   Date/Time:  Wednesday October 10 2014 15:35:27 EDT Ventricular Rate:  63 PR Interval:  118 QRS Duration: 80 QT Interval:  372 QTC Calculation: 380 R Axis:   45 Text Interpretation:  Normal sinus rhythm Normal ECG Confirmed by    MD,  ([id number]) on 10/10/2014 6:54:21 PM      MDM   Final diagnoses:  Gastroesophageal reflux disease without esophagitis  Anxiety        Nelva Nayobert , MD 10/10/14 2204

## 2014-10-11 ENCOUNTER — Other Ambulatory Visit: Payer: Self-pay

## 2014-10-11 ENCOUNTER — Telehealth: Payer: Self-pay | Admitting: Gastroenterology

## 2014-10-11 DIAGNOSIS — R1013 Epigastric pain: Secondary | ICD-10-CM

## 2014-10-11 NOTE — Telephone Encounter (Signed)
Sch'd pt w/Amy Esterwood for 10-29-14 @ 2:00 Pt aware

## 2014-10-11 NOTE — Telephone Encounter (Signed)
I have made referral in epic

## 2014-10-11 NOTE — Telephone Encounter (Signed)
He can have the next APP appt available.  I don't have any other work in spots. Please help him schedule.

## 2014-10-15 ENCOUNTER — Ambulatory Visit (INDEPENDENT_AMBULATORY_CARE_PROVIDER_SITE_OTHER): Payer: 59 | Admitting: Family Medicine

## 2014-10-15 ENCOUNTER — Encounter: Payer: Self-pay | Admitting: Family Medicine

## 2014-10-15 VITALS — BP 120/80 | HR 88 | Wt 190.0 lb

## 2014-10-15 DIAGNOSIS — R1013 Epigastric pain: Secondary | ICD-10-CM

## 2014-10-15 NOTE — Progress Notes (Signed)
   Subjective:    Patient ID: Arthur [name], male    DOB: Apr 14, 1976, 39 y.o.   MRN: [id number]  HPI He is here for recheck. He continues had difficulty with intermittent aching chest sensation. He does state that it sometimes causes chest tightness and gets worse with sitting. He has had trouble eating and has lost some weight. He states now that when he eats he will get a pressure sensation as well as bloating and a bad taste in his mouth. He's had difficulty with diarrhea during the same timeframe. He is apparently set up to see GI but it is several weeks away.  Review of Systems     Objective:   Physical Exam Alert and in no distress. Cardiac exam shows regular rhythm without murmurs or gallops. Lungs are clear to auscultation. Abdominal exam shows normal bowel sounds without masses or tenderness. Negative Murphy's sign and Murphy's punch.       Assessment & Plan:  Abdominal pain, epigastric - Plan: US Abdomen Limited RUQ Also discussed stopping the care faint since it doesn't seem be doing much [name].

## 2014-10-16 ENCOUNTER — Other Ambulatory Visit: Payer: Self-pay

## 2014-10-16 ENCOUNTER — Ambulatory Visit
Admission: RE | Admit: 2014-10-16 | Discharge: 2014-10-16 | Disposition: A | Discharge status: Home / Self Care | Payer: 59 | Source: Ambulatory Visit | Attending: Family Medicine | Admitting: Family Medicine

## 2014-10-16 DIAGNOSIS — R1013 Epigastric pain: Secondary | ICD-10-CM

## 2014-10-17 ENCOUNTER — Encounter: Payer: Self-pay | Admitting: Family Medicine

## 2014-10-18 ENCOUNTER — Ambulatory Visit (HOSPITAL_COMMUNITY): Payer: 59

## 2014-10-29 ENCOUNTER — Encounter: Payer: Self-pay | Admitting: Physician Assistant

## 2014-10-29 ENCOUNTER — Ambulatory Visit (INDEPENDENT_AMBULATORY_CARE_PROVIDER_SITE_OTHER): Payer: 59 | Admitting: Physician Assistant

## 2014-10-29 VITALS — BP 100/70 | HR 80 | Ht 69.0 in | Wt 185.0 lb

## 2014-10-29 DIAGNOSIS — K219 Gastro-esophageal reflux disease without esophagitis: Secondary | ICD-10-CM | POA: Diagnosis not present

## 2014-10-29 DIAGNOSIS — F419 Anxiety disorder, unspecified: Secondary | ICD-10-CM

## 2014-10-29 MED ORDER — OMEPRAZOLE 40 MG PO CPDR: 40.0000 mg | DELAYED_RELEASE_CAPSULE | Freq: Every day | ORAL | Status: DC

## 2014-10-29 NOTE — Progress Notes (Signed)
Reviewed and agree with management plan.   T. , MD FACG 

## 2014-10-29 NOTE — Patient Instructions (Addendum)
[facility], [place], [place]. 1. Omeprazole 40 mg.  Take a daily probiotic. 1. Align or Cultrelle. Take Benefiber daily. [facility] or [facility].  We made a referral to Behavioral health for Chronic anxiety.  We have given you a brochure  for Dr Caralyn Guileavid Gutterman. Please contact his office to schedule an appointment. You may contact him @ phone # [phone].  Call us mid August to make a follow up appointment with Amy Esterwood PA-C.

## 2014-10-29 NOTE — Progress Notes (Signed)
Patient ID: Arthur Ho, male   DOB: Apr 25, 1976, 39 y.o.   MRN: [id number]   Subjective:    Patient ID: Arthur Ho, male    DOB: 1975/06/25, 39 y.o.   MRN: [id number]  HPI Arthur Ho is a pleasant 39 year old white male known to Arthur Ho who had last been seen in 2013 with right thoracic and flank pain He is referred today by Arthur Ho for evaluation of abdominal pain, reflux symptoms and change in stools. Patient says he has had a couple of recent ER visits with episodes of chest pain. He described it as a ripping sensation and says he felt like he was going to have a heart attack. He also describes this pain as burning in nature. He has been placed on twice daily Prilosec, given Carafate and also had been given a trial of Levbid which she could not tolerate due to urinary retention. He has also been diagnosed with chronic anxiety but is just using Xanax at bedtime currently because he says it makes him to sedated to take it during the day. He says he believes that his anxiety is at least part of the issue with all of his GI symptoms. Since being on medication he says he has not had any more severe symptoms but he has a constant annoying discomfort in the subxiphoid area. He had lost about 30 pounds since onset of his symptoms and says he admits that he was "scared to eat because he was afraid it was going to bring on the chest pain and discomfort. He is since that time He has changed his diet drastically has gotten off of fried and greasy foods etc. and is eating much healthier. He says his weight loss has led Arthur Ho. He has the head of his bed elevated and has been following an anti-reflux regimen. Says occasionally he wakes up with a "acid sensation in the back of his throat that's happening less frequently. No current complaints of dysphagia. On further questioning he says his bowels are moving regularly but his notice that his stools are narrow at times and at times he has loose stools. No melena or  hematochezia.  Review of Systems Pertinent positive and negative review of systems were noted in the above HPI section.  All other review of systems was otherwise negative.  Outpatient Encounter Prescriptions as of 10/29/2014  Medication Sig  . ALPRAZolam (XANAX) 0.25 MG tablet Take 1 tablet (0.25 mg total) by mouth at bedtime as needed for anxiety.  Marland Kitchen omeprazole (PRILOSEC) 20 MG capsule Take 1 capsule (20 mg total) by mouth 2 (two) times daily before a meal. (Patient taking differently: Take 20 mg by mouth daily. )  . [DISCONTINUED] sucralfate (CARAFATE) 1 G tablet Take 1 tablet (1 g total) by mouth 4 (four) times daily -  with meals and at bedtime.  Marland Kitchen omeprazole (PRILOSEC) 40 MG capsule Take 1 capsule (40 mg total) by mouth daily.   No facility-administered encounter medications on file as of 10/29/2014.   Allergies  Allergen Reactions  . Contrast Media [Iodinated Diagnostic Agents] Palpitations    SOB  . Penicillins Anaphylaxis   Patient Active Problem List   Diagnosis Date Noted  . GERD (gastroesophageal reflux disease) 10/29/2014  . History of kidney stones 04/13/2012   History   Social History  . Marital Status: Married    Spouse Name: N/A  . Number of Children: 1  . Years of Education: N/A   Occupational History  . insurance agent  Social History Main Topics  . Smoking status: Former Smoker    Quit date: 05/04/2008  . Smokeless tobacco: Never Used  . Alcohol Use: No  . Drug Use: No  . Sexual Activity: Not on file   Other Topics Concern  . Not on file   Social History Narrative    Mr. Adela GlimpseGotshall's family history includes Allergies in his maternal grandfather; Colon cancer in his paternal grandfather; Emphysema in his maternal grandfather; Nephrolithiasis in his maternal grandfather; Vision loss in his maternal grandfather.      Objective:    Filed Vitals:   10/29/14 1351  BP: 100/70  Pulse: 80    Physical Exam  well-developed white male in no acute  distress, anxious blood pressure 100/70 pulse 80 height 5 foot 9 weight 185. HEENT; nontraumatic normocephalic EOMI PERRLA sclera anicteric, Supple ;no JVD, Cardiovascular; regular rate and rhythm with S1-S2 no murmur or gallop, Pulmonary; clear bilaterally, Abdomen; soft minimally tender in the subxiphoid area there is no palpable mass or hepatosplenomegaly bowel sounds are present, Rectal; exam not done, Extremities; no clubbing cyanosis or edema skin warm and dry, Psych; mood and affect appropriate       Assessment & Plan:   #1 39 yo male with chronic GERD, and recent episodes of chest pain with negative ER evaluations  SXS most consistent with GERD and esophagitis #2 IBS- alternating bowel habits  #3 chronic anxiety disorder- needs treated  Plan; Continue prilosec , increase to 40 mg po qam Antireflux regimen  Add daily probiotic- Align or Cul;turelle  Add benefiber daily Discussed EGD and colonoscopy - pt would like to try medical management first as above  Discussed referral to Psych/ behavioral health For chronic anxiety and he is amenable-he will likely benefit from SSRI- [facility]   Office follow up in 2-3 months  Amy Oswald HillockS Esterwood PA-C 10/29/2014   Cc: Ronnald NianLalonde, John C, MD

## 2014-11-01 ENCOUNTER — Ambulatory Visit (HOSPITAL_COMMUNITY): Payer: 59

## 2014-11-02 ENCOUNTER — Ambulatory Visit (INDEPENDENT_AMBULATORY_CARE_PROVIDER_SITE_OTHER): Payer: 59 | Admitting: Psychology

## 2014-11-02 DIAGNOSIS — F411 Generalized anxiety disorder: Secondary | ICD-10-CM

## 2014-11-19 ENCOUNTER — Ambulatory Visit: Payer: 59 | Admitting: Psychology

## 2014-11-20 ENCOUNTER — Ambulatory Visit: Payer: 59 | Admitting: Psychology

## 2014-11-23 ENCOUNTER — Ambulatory Visit (INDEPENDENT_AMBULATORY_CARE_PROVIDER_SITE_OTHER): Payer: 59 | Admitting: Psychology

## 2014-11-23 DIAGNOSIS — F411 Generalized anxiety disorder: Secondary | ICD-10-CM | POA: Diagnosis not present

## 2014-12-06 ENCOUNTER — Ambulatory Visit (INDEPENDENT_AMBULATORY_CARE_PROVIDER_SITE_OTHER): Payer: 59 | Admitting: Psychology

## 2014-12-06 DIAGNOSIS — F411 Generalized anxiety disorder: Secondary | ICD-10-CM

## 2014-12-18 ENCOUNTER — Telehealth: Payer: Self-pay | Admitting: Physician Assistant

## 2014-12-18 NOTE — Telephone Encounter (Signed)
Patient called as he was instructed to make appointment to see Amy Esterwood PA-C. Made appointment for 12-26-2014 at 3:00 PM . Follow up for GERD.

## 2014-12-20 ENCOUNTER — Ambulatory Visit (INDEPENDENT_AMBULATORY_CARE_PROVIDER_SITE_OTHER): Payer: 59 | Admitting: Psychology

## 2014-12-20 DIAGNOSIS — F411 Generalized anxiety disorder: Secondary | ICD-10-CM

## 2014-12-26 ENCOUNTER — Ambulatory Visit (INDEPENDENT_AMBULATORY_CARE_PROVIDER_SITE_OTHER): Payer: 59 | Admitting: Gastroenterology

## 2014-12-26 ENCOUNTER — Encounter: Payer: Self-pay | Admitting: Gastroenterology

## 2014-12-26 VITALS — BP 138/72 | HR 70 | Ht 68.0 in | Wt 178.0 lb

## 2014-12-26 DIAGNOSIS — K219 Gastro-esophageal reflux disease without esophagitis: Secondary | ICD-10-CM

## 2014-12-26 DIAGNOSIS — F419 Anxiety disorder, unspecified: Secondary | ICD-10-CM | POA: Diagnosis not present

## 2014-12-26 DIAGNOSIS — F411 Generalized anxiety disorder: Secondary | ICD-10-CM | POA: Insufficient documentation

## 2014-12-26 MED ORDER — ALPRAZOLAM 0.25 MG PO TABS
0.2500 mg | ORAL_TABLET | Freq: Every evening | ORAL | Status: DC | PRN
Start: 2014-12-26 — End: 2017-01-08

## 2014-12-26 NOTE — Progress Notes (Signed)
     12/26/2014 Arthur Ho [id number] November 29, 1975   History of Present Illness:  This is a pleasant 39 year old male who returns to our office today for a 2 month follow-up of his GERD/chest pain and alternating bowel habits.  Was seen in the office on 10/29/2014 by one of the other PA's.  Thought to have a lot of anxiety related symptoms.  Was placed on Omeprazole 40 mg daily and daily probiotic.  Also was referred to behavioral health for anxiety where he is now seeing a psychologist.  Says that he is feeling much better.  His bowel habits have been normal recently; is taking Librarian, academic.  His reflux and chest pain is much better and continues to get a little better every day.     Current Medications, Allergies, Past Medical History, Past Surgical History, Family History and Social History were reviewed in [employer] record.   Physical Exam: BP 138/72 mmHg  Pulse 70  Ht  (1.727 m)  Wt 178 lb (80.74 kg)  BMI 27.07 kg/m2 General: Well developed white male in no acute distress Head: Normocephalic and atraumatic Eyes:  Sclerae anicteric, conjunctiva pink  Ears: Normal auditory acuity Lungs: Clear throughout to auscultation Heart: Regular rate and rhythm Abdomen: Soft, non-distended.  Normal bowel sounds.  Non-tender. Musculoskeletal: Symmetrical with no gross deformities  Extremities: No edema  Neurological: Alert oriented x 4, grossly non-focal Psychological:  Alert and cooperative. Normal mood and affect  Assessment and Recommendations: #1 39 yo male with chronic GERD and recent episodes of chest pain with negative ER evaluations.  Symptoms most consistent with GERD and esophagitis, possibly some anxiety related chest pain as well.  Improved significantly on PPI; will continue omeprazole 40 mg daily. #2 IBS: Alternating bowel habits.  Once again possibly some anxiety related symptoms.  Symptoms resolved recently.  On probiotic, align; will continue. #3  Chronic anxiety disorder:  Seeing a psychologist.  He is asking for a prescription of a few Xanax to keep on hand.  I told him that I will renew his prescription for 10 pills but then he must get this from his PCP in the future and he understands.  Also discussed that maybe a daily SSRI would help as well, which can be discussed with his PCP.  *Will return to the office prn if symptoms return or worsen at which time we would re-discuss EGD.

## 2014-12-26 NOTE — Progress Notes (Signed)
Reviewed and agree with management plan. Going forward we will only provide prescriptions, refills for GI medications.  Arthur Ho. Russella Dar, MD [facility]

## 2015-01-03 ENCOUNTER — Ambulatory Visit (INDEPENDENT_AMBULATORY_CARE_PROVIDER_SITE_OTHER): Payer: 59 | Admitting: Psychology

## 2015-01-03 DIAGNOSIS — F411 Generalized anxiety disorder: Secondary | ICD-10-CM | POA: Diagnosis not present

## 2015-01-15 ENCOUNTER — Ambulatory Visit (INDEPENDENT_AMBULATORY_CARE_PROVIDER_SITE_OTHER): Payer: 59 | Admitting: Psychology

## 2015-01-15 DIAGNOSIS — F411 Generalized anxiety disorder: Secondary | ICD-10-CM | POA: Diagnosis not present

## 2015-02-12 ENCOUNTER — Ambulatory Visit: Payer: 59 | Admitting: Psychology

## 2015-02-12 ENCOUNTER — Ambulatory Visit (INDEPENDENT_AMBULATORY_CARE_PROVIDER_SITE_OTHER): Payer: 59 | Admitting: Psychology

## 2015-02-12 DIAGNOSIS — F411 Generalized anxiety disorder: Secondary | ICD-10-CM

## 2015-02-13 ENCOUNTER — Ambulatory Visit: Payer: 59 | Admitting: Psychology

## 2015-03-12 ENCOUNTER — Ambulatory Visit: Payer: 59 | Admitting: Psychology

## 2015-04-09 ENCOUNTER — Ambulatory Visit (INDEPENDENT_AMBULATORY_CARE_PROVIDER_SITE_OTHER): Payer: 59 | Admitting: Psychology

## 2015-04-09 DIAGNOSIS — F411 Generalized anxiety disorder: Secondary | ICD-10-CM

## 2015-05-14 ENCOUNTER — Ambulatory Visit: Payer: 59 | Admitting: Psychology

## 2015-05-30 ENCOUNTER — Ambulatory Visit (INDEPENDENT_AMBULATORY_CARE_PROVIDER_SITE_OTHER): Payer: 59 | Admitting: Psychology

## 2015-05-30 DIAGNOSIS — F411 Generalized anxiety disorder: Secondary | ICD-10-CM

## 2015-06-05 DIAGNOSIS — H43313 Vitreous membranes and strands, bilateral: Secondary | ICD-10-CM | POA: Diagnosis not present

## 2015-06-05 DIAGNOSIS — H5213 Myopia, bilateral: Secondary | ICD-10-CM | POA: Diagnosis not present

## 2015-06-26 ENCOUNTER — Ambulatory Visit (INDEPENDENT_AMBULATORY_CARE_PROVIDER_SITE_OTHER): Payer: 59 | Admitting: Psychology

## 2015-06-26 DIAGNOSIS — F411 Generalized anxiety disorder: Secondary | ICD-10-CM

## 2015-07-18 ENCOUNTER — Ambulatory Visit (INDEPENDENT_AMBULATORY_CARE_PROVIDER_SITE_OTHER): Payer: 59 | Admitting: Psychology

## 2015-07-18 DIAGNOSIS — F411 Generalized anxiety disorder: Secondary | ICD-10-CM | POA: Diagnosis not present

## 2015-08-28 ENCOUNTER — Ambulatory Visit (INDEPENDENT_AMBULATORY_CARE_PROVIDER_SITE_OTHER): Payer: 59 | Admitting: Psychology

## 2015-08-28 DIAGNOSIS — F411 Generalized anxiety disorder: Secondary | ICD-10-CM

## 2015-09-24 ENCOUNTER — Ambulatory Visit: Payer: 59 | Admitting: Psychology

## 2015-10-08 ENCOUNTER — Ambulatory Visit (INDEPENDENT_AMBULATORY_CARE_PROVIDER_SITE_OTHER): Payer: 59 | Admitting: Psychology

## 2015-10-08 DIAGNOSIS — F411 Generalized anxiety disorder: Secondary | ICD-10-CM

## 2015-11-19 ENCOUNTER — Ambulatory Visit: Payer: 59 | Admitting: Psychology

## 2016-05-01 ENCOUNTER — Telehealth: Payer: 59 | Admitting: Family

## 2016-05-01 DIAGNOSIS — J329 Chronic sinusitis, unspecified: Secondary | ICD-10-CM | POA: Diagnosis not present

## 2016-05-01 DIAGNOSIS — B9789 Other viral agents as the cause of diseases classified elsewhere: Secondary | ICD-10-CM | POA: Diagnosis not present

## 2016-05-01 MED ORDER — FLUTICASONE PROPIONATE 50 MCG/ACT NA SUSP: 2.0000 | Freq: Every day | NASAL | 0 refills | Status: DC

## 2016-05-01 NOTE — Progress Notes (Signed)

## 2016-06-05 ENCOUNTER — Telehealth: Payer: 59 | Admitting: Nurse Practitioner

## 2016-06-05 DIAGNOSIS — R05 Cough: Secondary | ICD-10-CM | POA: Diagnosis not present

## 2016-06-05 DIAGNOSIS — R059 Cough, unspecified: Secondary | ICD-10-CM

## 2016-06-05 MED ORDER — AZITHROMYCIN 250 MG PO TABS: ORAL_TABLET | ORAL | 0 refills | Status: DC

## 2016-06-05 NOTE — Progress Notes (Signed)

## 2016-06-24 DIAGNOSIS — H5213 Myopia, bilateral: Secondary | ICD-10-CM | POA: Diagnosis not present

## 2016-06-24 DIAGNOSIS — H43313 Vitreous membranes and strands, bilateral: Secondary | ICD-10-CM | POA: Diagnosis not present

## 2017-01-08 ENCOUNTER — Telehealth: Payer: Self-pay

## 2017-01-08 ENCOUNTER — Ambulatory Visit (INDEPENDENT_AMBULATORY_CARE_PROVIDER_SITE_OTHER): Payer: 59 | Admitting: Family Medicine

## 2017-01-08 ENCOUNTER — Encounter: Payer: Self-pay | Admitting: Family Medicine

## 2017-01-08 VITALS — BP 138/80 | HR 62 | Resp 16 | Ht 67.5 in | Wt 207.8 lb

## 2017-01-08 DIAGNOSIS — Z8249 Family history of ischemic heart disease and other diseases of the circulatory system: Secondary | ICD-10-CM | POA: Diagnosis not present

## 2017-01-08 DIAGNOSIS — F419 Anxiety disorder, unspecified: Secondary | ICD-10-CM

## 2017-01-08 DIAGNOSIS — I491 Atrial premature depolarization: Secondary | ICD-10-CM

## 2017-01-08 MED ORDER — ALPRAZOLAM 0.25 MG PO TABS: 0.2500 mg | ORAL_TABLET | Freq: Every evening | ORAL | 0 refills | Status: DC | PRN

## 2017-01-08 NOTE — Progress Notes (Signed)
   Subjective:    Patient ID: Pauline Goodyan W Santelli, male    DOB: 02-21-1976, 41 y.o.   MRN: [id number]  HPI He complains of a four-day history of difficulty with irregular heartbeat and palpitations. He states that he has this in the past but never to this long period of time. He's had no chest pain, shortness of breath or weakness, nausea, vomiting. He is quite anxious over this. He does have a previous history of difficulty with anxiety and has been in counseling. He is under a lot of stress mainly revolving around work and worrying about his heart. He states that there is a family history of atrial fibrillation.   Review of Systems     Objective:   Physical Exam Alert and in no distress. A bigeminy type rhythm is noted with EKG showing PACs. Lungs are clear to auscultation.       Assessment & Plan:  Premature atrial contractions  Anxiety - Plan: ALPRAZolam (XANAX) 0.25 MG tablet  Family history of atrial fibrillation I reassured him that the rhythm that he is in is not dangerous. Discussed the possibility of using medication to get this under control. We then discussed his anxiety. He is to return to his previous therapist to work further on his anxiety. I will also give a small dose of Xanax to help with that.

## 2017-01-08 NOTE — Telephone Encounter (Signed)
[facility]

## 2017-02-15 ENCOUNTER — Ambulatory Visit
Admission: EM | Admit: 2017-02-15 | Discharge: 2017-02-15 | Disposition: A | Discharge status: Home / Self Care | Payer: 59 | Attending: Family Medicine | Admitting: Family Medicine

## 2017-02-15 DIAGNOSIS — R5381 Other malaise: Secondary | ICD-10-CM | POA: Insufficient documentation

## 2017-02-15 DIAGNOSIS — S61432A Puncture wound without foreign body of left hand, initial encounter: Secondary | ICD-10-CM | POA: Diagnosis not present

## 2017-02-15 DIAGNOSIS — R05 Cough: Secondary | ICD-10-CM | POA: Diagnosis not present

## 2017-02-15 DIAGNOSIS — Z23 Encounter for immunization: Secondary | ICD-10-CM | POA: Diagnosis not present

## 2017-02-15 DIAGNOSIS — W450XXA Nail entering through skin, initial encounter: Secondary | ICD-10-CM

## 2017-02-15 DIAGNOSIS — R002 Palpitations: Secondary | ICD-10-CM | POA: Diagnosis not present

## 2017-02-15 DIAGNOSIS — J069 Acute upper respiratory infection, unspecified: Secondary | ICD-10-CM

## 2017-02-15 MED ORDER — CETIRIZINE HCL 10 MG PO TABS: 10.0000 mg | ORAL_TABLET | Freq: Every day | ORAL | 0 refills | Status: DC

## 2017-02-15 MED ORDER — SULFAMETHOXAZOLE-TRIMETHOPRIM 800-160 MG PO TABS: 1.0000 | ORAL_TABLET | Freq: Two times a day (BID) | ORAL | 0 refills | Status: DC

## 2017-02-15 MED ORDER — TETANUS-DIPHTH-ACELL PERTUSSIS 5-2.5-18.5 LF-MCG/0.5 IM SUSP
0.5000 mL | Freq: Once | INTRAMUSCULAR | Status: AC
  Administered 2017-02-15: 0.5 mL via INTRAMUSCULAR

## 2017-02-15 NOTE — ED Triage Notes (Signed)
Patient complains of weakness, fatigue with a cough that started 4 days ago without fever. Patient states that he feels "crappy". Patient also reports that on Saturday he obtained a puncture wound while working at a barn by a rusty nail. Unsure of last tetanus vaccine, will update today. Patient reports that he has been noticing some palpitations/heart flutter over 1 week.

## 2017-02-15 NOTE — ED Provider Notes (Signed)
[facility]    CSN: [id number] Arrival date & time: 02/15/17  0909     History   Chief Complaint Chief Complaint  Patient presents with  . Cough  . Puncture Wound  . Palpitations    HPI Arthur Ho is a 41 y.o. male.   Patient is a 41 year old male with past medical history of anxiety who presents with complaining of feeling clammy with cold sweats over the last 24 hours and having a puncture wound from a rusty nail that happened2 days ago on Saturday. Patient denies any fever but reports cough congestion with green mucus production. Patient paints a picture from overall general malaise. Patient also complaining of heart palpitations accompanied frequently. Reports a strong family heart history including his brother who is alert he had an ablation for atrial fibrillation. Patient was seen by his PCP recently with EKG showing frequent PACs. Patient declined any medications for control at that time. Patient ports taken ibuprofen. Patient denies abdominal issues or shortness of breath.  Pt reports some pus from wound yesterday and this morning. Pt does not know date of last tetanus booster.      Past Medical History:  Diagnosis Date  . Calcium nephrolithiasis   . GERD (gastroesophageal reflux disease)     Patient Active Problem List   Diagnosis Date Noted  . Esophageal reflux 12/26/2014  . Anxiety 12/26/2014  . GERD (gastroesophageal reflux disease) 10/29/2014  . History of kidney stones 04/13/2012    Past Surgical History:  Procedure Laterality Date  . HERNIA REPAIR     R inguinal  . KIDNEY STONE SURGERY     basket retrieval x several  . LITHOTRIPSY     x2       Home Medications    Prior to Admission medications   Medication Sig Start Date End Date Taking? Authorizing Provider  ALPRAZolam (XANAX) 0.25 MG tablet Take 1 tablet (0.25 mg total) by mouth at bedtime as needed for anxiety. 01/08/17  Yes Ronnald Nian, MD  cetirizine (ZYRTEC) 10 MG  tablet Take 1 tablet (10 mg total) by mouth daily. 02/15/17   Candis Schatz, PA-C  sulfamethoxazole-trimethoprim (BACTRIM DS,SEPTRA DS) 800-160 MG tablet Take 1 tablet by mouth 2 (two) times daily. 02/15/17 02/22/17  Candis Schatz, PA-C    Family History Family History  Problem Relation Age of Onset  . Allergies Maternal Grandfather   . Vision loss Maternal Grandfather   . Emphysema Maternal Grandfather   . Nephrolithiasis Maternal Grandfather   . Colon cancer Paternal Grandfather     Social History Social History  Substance Use Topics  . Smoking status: Former Smoker    Quit date: 05/04/2008  . Smokeless tobacco: Never Used  . Alcohol use No     Allergies   Contrast media [iodinated diagnostic agents] and Penicillins   Review of Systems Review of Systems  As noted above in history of present illness. Other systems reviewed and found be negative   Physical Exam Triage Vital Signs ED Triage Vitals  Enc Vitals Group     BP 02/15/17 0924 98/70     Pulse Rate 02/15/17 0924 80     Resp 02/15/17 0924 18     Temp 02/15/17 0924 97.7 F (36.5 C)     Temp Source 02/15/17 0924 Oral     SpO2 02/15/17 0924 98 %     Weight 02/15/17 0925 200 lb (90.7 kg)     Height 02/15/17 0925  (1.727  m)     Head Circumference --      Peak Flow --      Pain Score 02/15/17 0925 0     Pain Loc --      Pain Edu? --      Excl. in GC? --    No data found.   Updated Vital Signs BP 98/70 (BP Location: Left Arm)   Pulse 80   Temp 97.7 F (36.5 C) (Oral)   Resp 18   Ht  (1.727 m)   Wt 200 lb (90.7 kg)   SpO2 98%   BMI 30.41 kg/m    Physical Exam  Constitutional: He is oriented to person, place, and time. He appears well-developed and well-nourished.  HENT:  Right Ear: Ear canal normal. No mastoid tenderness. A middle ear effusion is present.  Left Ear: Ear canal normal. No mastoid tenderness. A middle ear effusion is present.  Nose: Right sinus exhibits no maxillary  sinus tenderness and no frontal sinus tenderness. Left sinus exhibits no maxillary sinus tenderness and no frontal sinus tenderness.  Mouth/Throat: Uvula is midline and oropharynx is clear and moist. Tonsils are 0 on the right. Tonsils are 0 on the left.  Post nasal drainage  Eyes: Pupils are equal, round, and reactive to light. EOM are normal.  Neck: Normal range of motion. Neck supple.  Cardiovascular: Normal rate, S1 normal, S2 normal, normal heart sounds and normal pulses.  An irregular rhythm present. Exam reveals no gallop.   No murmur heard. Pulmonary/Chest: Effort normal and breath sounds normal. No respiratory distress. He has no wheezes.  Abdominal: Soft. Bowel sounds are normal.  Musculoskeletal: Normal range of motion.       Hands: Lymphadenopathy:    He has no cervical adenopathy.  Neurological: He is alert and oriented to person, place, and time. No cranial nerve deficit.  Skin:  Cool and moist     UC Treatments / Results  Labs (all labs ordered are listed, but only abnormal results are displayed) Labs Reviewed - No data to display  EKG ED ECG REPORT  Date: 02/15/2017  EKG Time: 9:38 AM  Rate: 66  Rhythm: SR with frequent PACs  Intervals:none  ST&T Change: none  Narrative Interpretation: SR with marked sinus arrhythmia (per machine). PACs frequent but with similar morphology.      Radiology No results found.  Procedures Procedures (including critical care time)  Medications Ordered in UC Medications  Tdap (BOOSTRIX) injection 0.5 mL (0.5 mLs Intramuscular Given 02/15/17 0941)     Initial Impression / Assessment and Plan / UC Course  I have reviewed the triage vital signs and the nursing notes.  Pertinent labs & imaging results that were available during my care of the patient were reviewed by me and considered in my medical decision making (see chart for details).    Patient presents with generalized malaise with feeling cold and clammy and  complaints of upper respiratory symptoms as well as palpitations.patient's palpitations have been ongoing and his EKG with sinus rhythm and frequent PACs is similar to one from his PCP visit recently.  Final Clinical Impressions(s) / UC Diagnoses   Final diagnoses:  Palpitations  Upper respiratory tract infection, unspecified type  Puncture wound of left hand without foreign body, initial encounter   Patient given a tetanus booster. Give perception for Bactrim to cover both the hand as well as his upper respiratory symptoms. Also give patient prescription for Zyrtec. Recommend patient follow-up with his PCP for  cardiology consultation for his continued palpitations. New Prescriptions New Prescriptions   CETIRIZINE (ZYRTEC) 10 MG TABLET    Take 1 tablet (10 mg total) by mouth daily.   SULFAMETHOXAZOLE-TRIMETHOPRIM (BACTRIM DS,SEPTRA DS) 800-160 MG TABLET    Take 1 tablet by mouth 2 (two) times daily.     Controlled Substance Prescriptions [place] Controlled Substance Registry consulted? Not Applicable   Candis Schatz, PA-C 02/15/17 1610

## 2017-02-15 NOTE — Discharge Instructions (Signed)
-  Bactrim: one tablet every 12 hours for 7 days -Zyrtec: one tablet daily. Also available over the counter -can continue OTC medications for fever -push fluids -heart rhythm is not life threatening. Given continuing palpitations, recommend follow up with PCP for cardiology consult. -return to clinic if symptoms worsen. -keep hand wound clean

## 2017-02-17 ENCOUNTER — Encounter: Payer: Self-pay | Admitting: Family Medicine

## 2017-02-17 ENCOUNTER — Ambulatory Visit (INDEPENDENT_AMBULATORY_CARE_PROVIDER_SITE_OTHER): Payer: 59 | Admitting: Family Medicine

## 2017-02-17 VITALS — BP 120/80 | HR 75 | Temp 98.1°F | Resp 16 | Wt 199.2 lb

## 2017-02-17 DIAGNOSIS — R6889 Other general symptoms and signs: Secondary | ICD-10-CM | POA: Diagnosis not present

## 2017-02-17 DIAGNOSIS — R110 Nausea: Secondary | ICD-10-CM | POA: Diagnosis not present

## 2017-02-17 DIAGNOSIS — R10819 Abdominal tenderness, unspecified site: Secondary | ICD-10-CM | POA: Diagnosis not present

## 2017-02-17 DIAGNOSIS — R5383 Other fatigue: Secondary | ICD-10-CM

## 2017-02-17 LAB — CBC WITH DIFFERENTIAL/PLATELET
Basophils Absolute: 33 cells/uL (ref 0–200)
Basophils Relative: 0.4 %
Eosinophils Absolute: 16 cells/uL (ref 15–500)
Eosinophils Relative: 0.2 %
HCT: 45.6 % (ref 38.5–50.0)
Hemoglobin: 15.5 g/dL (ref 13.2–17.1)
Lymphs Abs: 1099 cells/uL (ref 850–3900)
MCH: 30.6 pg (ref 27.0–33.0)
MCHC: 34 g/dL (ref 32.0–36.0)
MCV: 90.1 fL (ref 80.0–100.0)
MPV: 9.8 fL (ref 7.5–12.5)
Monocytes Relative: 6.7 %
Neutro Abs: 6503 cells/uL (ref 1500–7800)
Neutrophils Relative %: 79.3 %
Platelets: 326 10*3/uL (ref 140–400)
RBC: 5.06 10*6/uL (ref 4.20–5.80)
RDW: 12 % (ref 11.0–15.0)
Total Lymphocyte: 13.4 %
WBC mixed population: 549 cells/uL (ref 200–950)
WBC: 8.2 10*3/uL (ref 3.8–10.8)

## 2017-02-17 LAB — COMPREHENSIVE METABOLIC PANEL
AG Ratio: 1.7 (calc) (ref 1.0–2.5)
ALT: 20 U/L (ref 9–46)
AST: 18 U/L (ref 10–40)
Albumin: 4.5 g/dL (ref 3.6–5.1)
Alkaline phosphatase (APISO): 48 U/L (ref 40–115)
BUN: 8 mg/dL (ref 7–25)
CO2: 23 mmol/L (ref 20–32)
Calcium: 9.2 mg/dL (ref 8.6–10.3)
Chloride: 102 mmol/L (ref 98–110)
Creat: 1.33 mg/dL (ref 0.60–1.35)
Globulin: 2.6 g/dL (calc) (ref 1.9–3.7)
Glucose, Bld: 80 mg/dL (ref 65–99)
Potassium: 4.4 mmol/L (ref 3.5–5.3)
Sodium: 137 mmol/L (ref 135–146)
Total Bilirubin: 0.7 mg/dL (ref 0.2–1.2)
Total Protein: 7.1 g/dL (ref 6.1–8.1)

## 2017-02-17 LAB — POCT URINALYSIS DIP (PROADVANTAGE DEVICE)
Bilirubin, UA: NEGATIVE
Blood, UA: NEGATIVE
Glucose, UA: NEGATIVE mg/dL
Leukocytes, UA: NEGATIVE
Nitrite, UA: NEGATIVE
Specific Gravity, Urine: 1.025
pH, UA: 6 (ref 5.0–8.0)

## 2017-02-17 LAB — POC INFLUENZA A&B (BINAX/QUICKVUE)
Influenza A, POC: NEGATIVE
Influenza B, POC: NEGATIVE

## 2017-02-17 LAB — POCT MONO (EPSTEIN BARR VIRUS): Mono, POC: NEGATIVE

## 2017-02-17 LAB — TSH: TSH: 1.46 mIU/L (ref 0.40–4.50)

## 2017-02-17 MED ORDER — ONDANSETRON 4 MG PO TBDP
4.0000 mg | ORAL_TABLET | Freq: Three times a day (TID) | ORAL | 0 refills | Status: DC | PRN
Start: 2017-02-17 — End: 2020-02-02

## 2017-02-17 NOTE — Patient Instructions (Signed)
Stop the Bactrim. Your symptoms appear to be viral related at this point.   Take the Zofran for nausea. Increase fluids and try some chicken soup or whatever sounds good.   We will call you with your lab results.   If you notice any new or worsening symptom call or return.

## 2017-02-17 NOTE — Progress Notes (Signed)
Subjective:    Patient ID: Arthur Ho, male    DOB: 07-08-1975, 41 y.o.   MRN: [id number]  HPI Chief Complaint  Patient presents with  . no better    no better from urgent care, weak, hot flashes, congestion. 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 haven't eaten since sunday, but drinking water,, nauseated all the time   He is here with complaints of a 2 day history of chills, night sweats, severe fatigue, body aches, nausea and decreased appetite that was preceded by a one week history of URI symptoms. He was seen at an UC and prescribed Bactrim which he started 3 days ago at the onset of his current symptoms. cold over a week ago. States he has not been eating and drinking for the past 2 days.  Denies fever, ear pain, rhinorrhea, nasal congestion, sinus pain, sore throat, cough, shortness of breath, chest pain, vomiting, urinary symptoms.   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   Last bowel movement was today and stool was loose. No blood or pus. Urinating a normal amount but darker than usual.   Reviewed allergies, medications, past medical, surgical,  and social history.   Review of Systems Pertinent positives and negatives in the history of present illness.     Objective:   Physical Exam  Constitutional: He is oriented to person, place, and time. He appears well-developed and well-nourished. He has a sickly appearance. No distress.  HENT:  Right Ear: Tympanic membrane and ear canal normal.  Left Ear: Tympanic membrane and ear canal normal.  Nose: Nose normal. Right sinus exhibits no maxillary sinus tenderness and no frontal sinus tenderness. Left sinus exhibits no maxillary sinus tenderness and no frontal sinus tenderness.  Mouth/Throat: Oropharynx is clear and moist.  Eyes: Pupils are equal, round, and reactive to light. Conjunctivae and lids are normal.  Neck: Full passive range of motion without pain.  Neck supple.  Cardiovascular: Normal rate, regular rhythm, normal heart sounds and intact distal pulses.   Pulmonary/Chest: Effort normal and breath sounds normal.  Abdominal: Soft. Normal appearance and bowel sounds are normal. There is no hepatosplenomegaly. There is tenderness in the right lower quadrant and left lower quadrant. There is no rigidity, no rebound, no guarding, no CVA tenderness, no tenderness at McBurney's point and negative Murphy's sign.  Negative psoas  Lymphadenopathy:    He has no cervical adenopathy.       Right: No supraclavicular adenopathy present.       Left: No supraclavicular adenopathy present.  Neurological: He is alert and oriented to person, place, and time. He has normal strength. Coordination and gait normal.  Skin: Skin is warm and dry. No rash noted. He is not diaphoretic. No pallor.  Psychiatric: He has a normal mood and affect. His speech is normal and behavior is normal. Thought content normal.   BP 120/80   Pulse 75   Temp 98.1 F (36.7 C) (Oral)   Resp 16   Wt 199 lb 3.2 oz (90.4 kg)   SpO2 95%   BMI 30.29 kg/m       Assessment & Plan:  Flu-like symptoms - Plan: CBC with Differential/Platelet, Comprehensive metabolic panel, POC Influenza A&B(BINAX/QUICKVUE), POCT Mono (Epstein Barr Virus)  Other fatigue - Plan: POCT Urinalysis DIP (Proadvantage Device), CBC with Differential/Platelet, Comprehensive metabolic panel, TSH  Nausea - Plan: ondansetron (ZOFRAN ODT) 4 MG disintegrating tablet  Lower abdominal tenderness - Plan: CBC with Differential/Platelet, Comprehensive metabolic panel  UA - urine is dark, large amount of ketones.  Flu swab is negative.  Mono negative.  Discussed that his symptoms appear more viral than bacterial and unlikely related to the antibiotic but will have him stop the antibiotic for now and see how he does.  He is not toxic and is hemodynamically stable.  Zofran prescribed for nausea. Encouraged him to increase  his fluid intake and try eating soup or foods that sound good to him.  He will report back and new or worsening symptoms.

## 2017-02-25 ENCOUNTER — Telehealth: Payer: 59 | Admitting: Family

## 2017-02-25 DIAGNOSIS — B9689 Other specified bacterial agents as the cause of diseases classified elsewhere: Secondary | ICD-10-CM

## 2017-02-25 DIAGNOSIS — J028 Acute pharyngitis due to other specified organisms: Secondary | ICD-10-CM | POA: Diagnosis not present

## 2017-02-25 MED ORDER — BENZONATATE 100 MG PO CAPS: 100.0000 mg | ORAL_CAPSULE | Freq: Three times a day (TID) | ORAL | 0 refills | Status: DC | PRN

## 2017-02-25 MED ORDER — PREDNISONE 5 MG PO TABS: 5.0000 mg | ORAL_TABLET | ORAL | 0 refills | Status: DC

## 2017-02-25 MED ORDER — AZITHROMYCIN 250 MG PO TABS: ORAL_TABLET | ORAL | 0 refills | Status: DC

## 2017-02-25 NOTE — Progress Notes (Signed)

## 2017-03-02 DIAGNOSIS — F419 Anxiety disorder, unspecified: Secondary | ICD-10-CM | POA: Diagnosis not present

## 2017-03-02 DIAGNOSIS — I493 Ventricular premature depolarization: Secondary | ICD-10-CM | POA: Diagnosis not present

## 2017-03-02 DIAGNOSIS — R29898 Other symptoms and signs involving the musculoskeletal system: Secondary | ICD-10-CM | POA: Diagnosis not present

## 2017-03-05 ENCOUNTER — Ambulatory Visit (INDEPENDENT_AMBULATORY_CARE_PROVIDER_SITE_OTHER): Payer: 59 | Admitting: Psychology

## 2017-03-05 DIAGNOSIS — F411 Generalized anxiety disorder: Secondary | ICD-10-CM | POA: Diagnosis not present

## 2017-03-05 DIAGNOSIS — R531 Weakness: Secondary | ICD-10-CM | POA: Diagnosis not present

## 2017-05-27 IMAGING — CR DG CHEST 2V
2 series · 2 of 2 positions shown · non-contrast
Comparison: 09/25/2014

CLINICAL DATA: Chest pressure and shortness of breath.

EXAM:
CHEST - 2 VIEW

[chest pa]
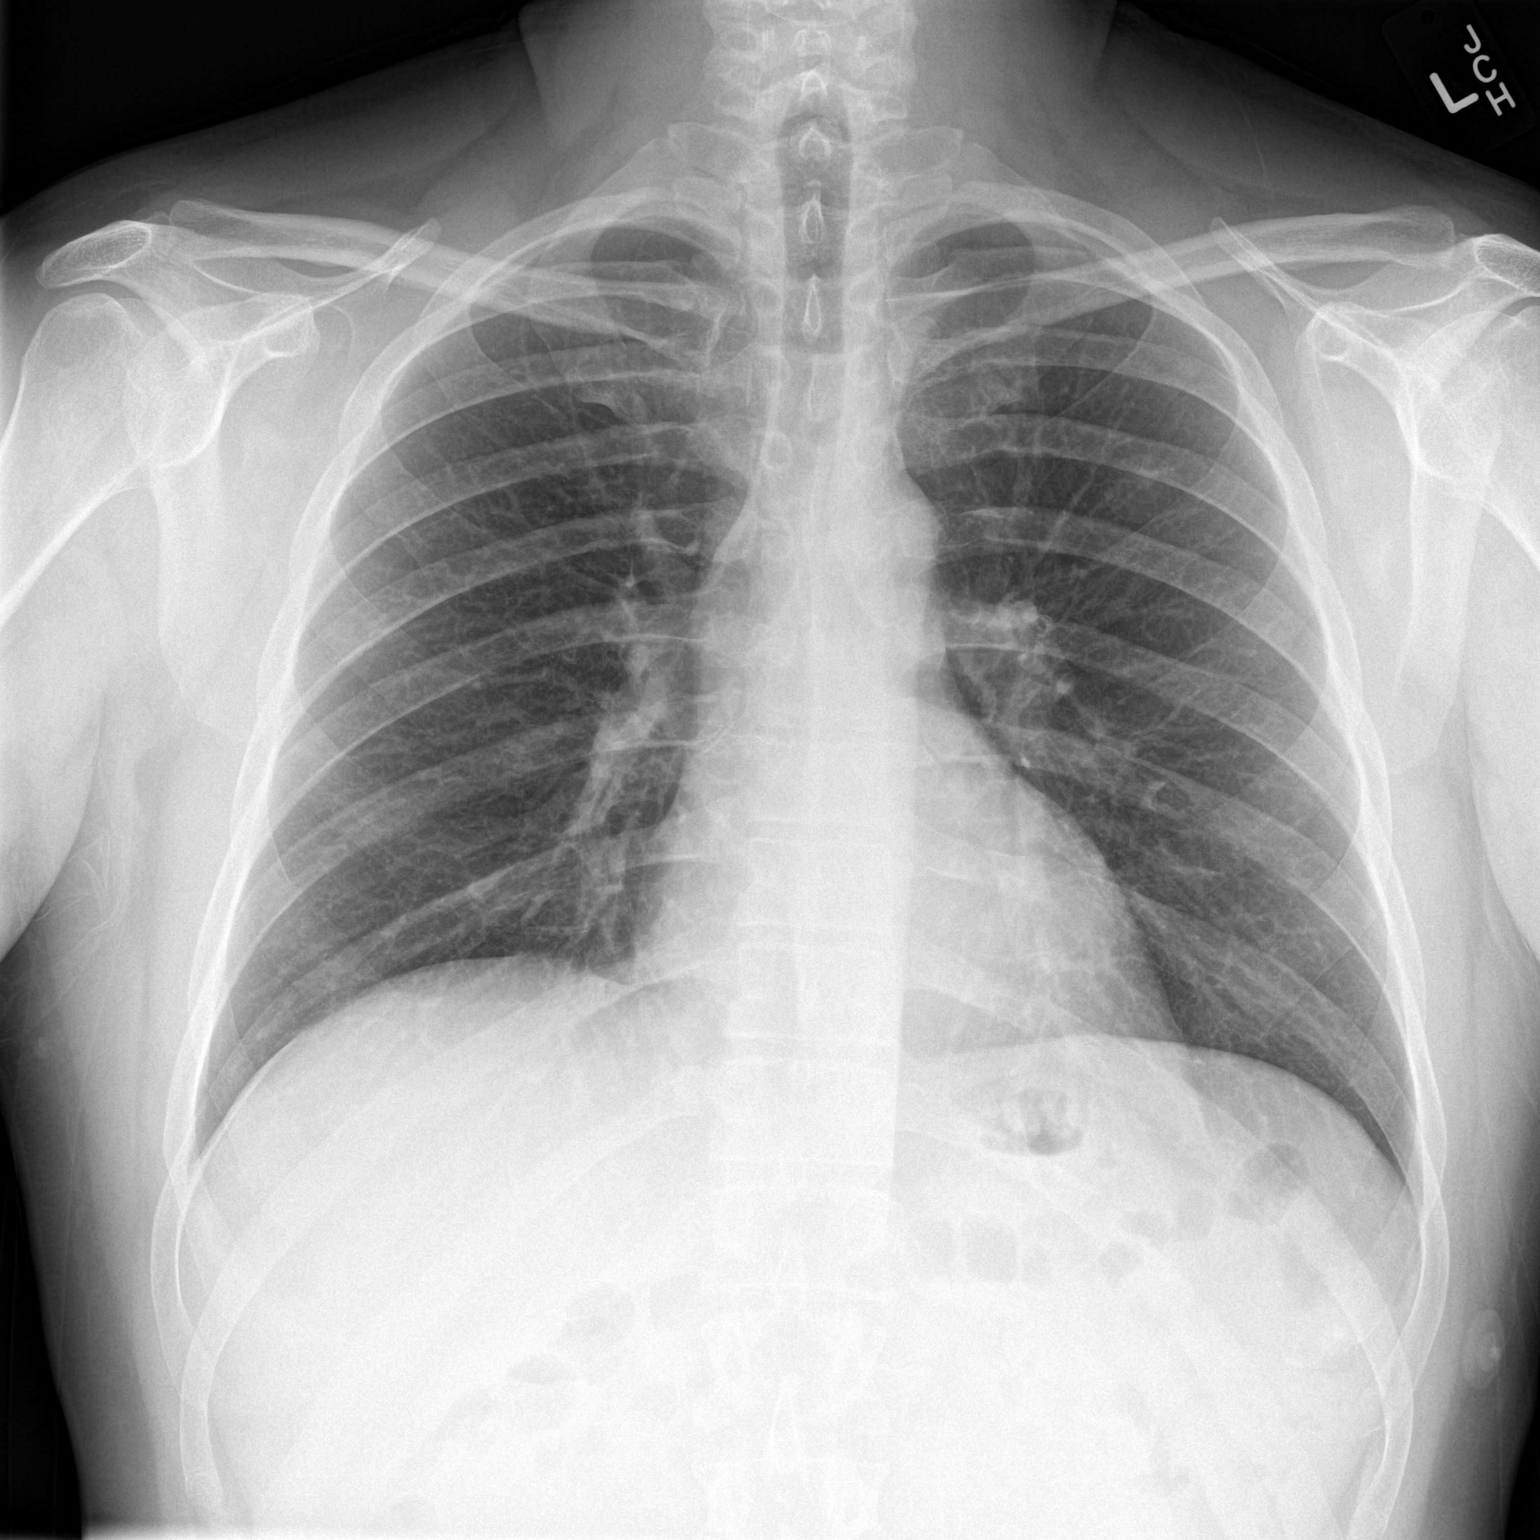

[chest lat]
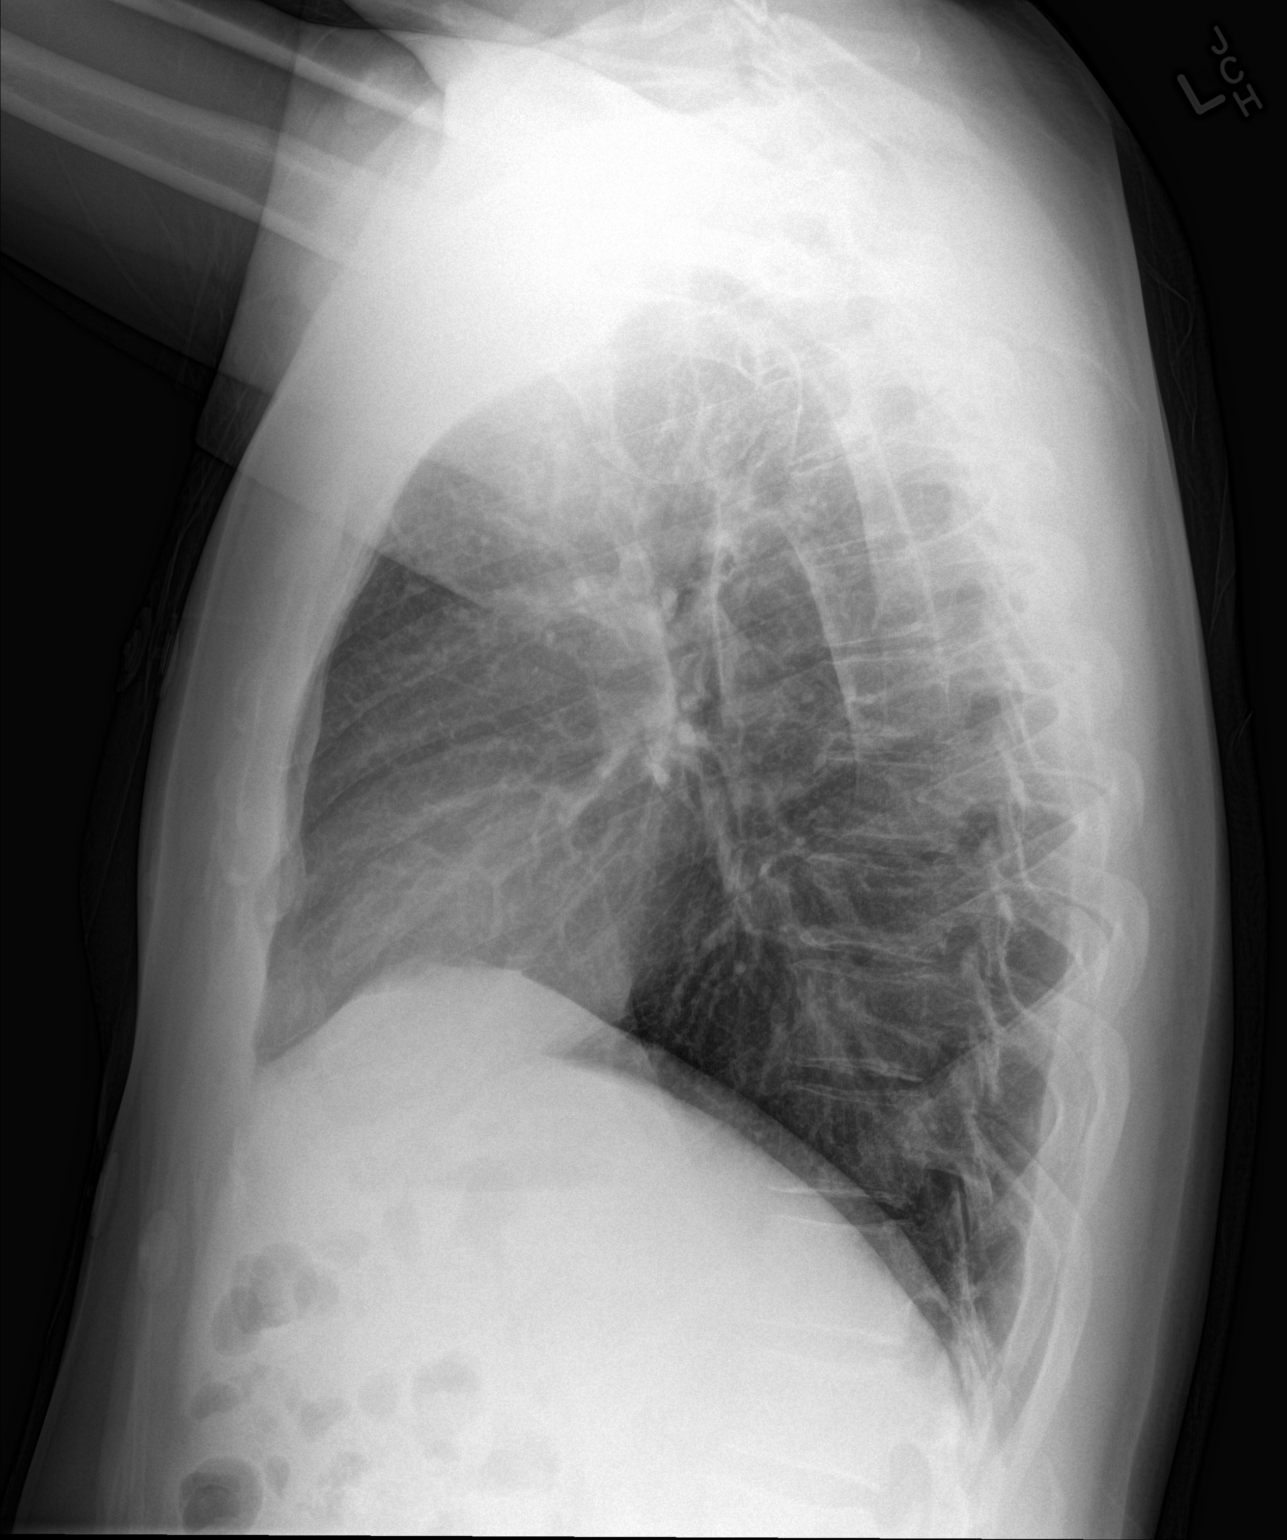

[2 of 2 positions shown; findings below may reference images not displayed]

FINDINGS: The heart size and mediastinal contours are within normal limits.
There is no evidence of pulmonary edema, consolidation,
pneumothorax, nodule or pleural fluid. The visualized skeletal
structures are unremarkable.
IMPRESSION: No active disease.

## 2017-07-15 DIAGNOSIS — H43313 Vitreous membranes and strands, bilateral: Secondary | ICD-10-CM | POA: Diagnosis not present

## 2017-07-15 DIAGNOSIS — H5213 Myopia, bilateral: Secondary | ICD-10-CM | POA: Diagnosis not present

## 2017-07-20 ENCOUNTER — Telehealth: Payer: Self-pay

## 2017-07-20 DIAGNOSIS — F419 Anxiety disorder, unspecified: Secondary | ICD-10-CM

## 2017-07-20 MED ORDER — ALPRAZOLAM 0.25 MG PO TABS: 0.2500 mg | ORAL_TABLET | Freq: Every evening | ORAL | 0 refills | Status: DC | PRN

## 2017-07-20 NOTE — Telephone Encounter (Signed)
Pt is requesting a refill on xanax sent to [facility] in [place]. Please advise . Thanks [employer]

## 2017-10-14 ENCOUNTER — Ambulatory Visit (INDEPENDENT_AMBULATORY_CARE_PROVIDER_SITE_OTHER): Payer: 59 | Admitting: Family Medicine

## 2017-10-14 ENCOUNTER — Encounter: Payer: Self-pay | Admitting: Family Medicine

## 2017-10-14 VITALS — BP 116/78 | HR 71 | Temp 98.3°F | Ht 67.0 in | Wt 213.2 lb

## 2017-10-14 DIAGNOSIS — F419 Anxiety disorder, unspecified: Secondary | ICD-10-CM

## 2017-10-14 DIAGNOSIS — E669 Obesity, unspecified: Secondary | ICD-10-CM | POA: Diagnosis not present

## 2017-10-14 DIAGNOSIS — Z6833 Body mass index (BMI) 33.0-33.9, adult: Secondary | ICD-10-CM | POA: Diagnosis not present

## 2017-10-14 DIAGNOSIS — Z87891 Personal history of nicotine dependence: Secondary | ICD-10-CM

## 2017-10-14 DIAGNOSIS — Z87442 Personal history of urinary calculi: Secondary | ICD-10-CM

## 2017-10-14 DIAGNOSIS — Z0000 Encounter for general adult medical examination without abnormal findings: Secondary | ICD-10-CM | POA: Diagnosis not present

## 2017-10-14 DIAGNOSIS — K219 Gastro-esophageal reflux disease without esophagitis: Secondary | ICD-10-CM

## 2017-10-14 NOTE — Progress Notes (Signed)
   Subjective:    Patient ID: Arthur Ho, male    DOB: 1976-03-06, 42 y.o.   MRN: [id number]  HPI He is here for complete examination.  He does have underlying anxiety but rarely uses Xanax.  He also has reflux symptoms but since he is changed his diet has not had much trouble with that or his underlying stones.  He apparently has had lithotripsy on this in the past.  He is also a former smoker.  His work and home life are going well.  Family and social history as well as health maintenance and immunizations was reviewed.  Review of Systems  All other systems reviewed and are negative.      Objective:   Physical Exam BP 116/78 (BP Location: Left Arm, Patient Position: Sitting)   Pulse 71   Temp 98.3 F (36.8 C)   Ht 5\' 7"  (1.702 m)   Wt 213 lb 3.2 oz (96.7 kg)   SpO2 95%   BMI 33.39 kg/m   General Appearance:    Alert, cooperative, no distress, appears stated age  Head:    Normocephalic, without obvious abnormality, atraumatic  Eyes:    PERRL, conjunctiva/corneas clear, EOM's intact, fundi    benign  Ears:    Normal TM's and external ear canals  Nose:   Nares normal, mucosa normal, no drainage or sinus   tenderness  Throat:   Lips, mucosa, and tongue normal; teeth and gums normal  Neck:   Supple, no lymphadenopathy;  thyroid:  no   enlargement/tenderness/nodules; no carotid   bruit or JVD     Lungs:     Clear to auscultation bilaterally without wheezes, rales or     ronchi; respirations unlabored      Heart:    Regular rate and rhythm, S1 and S2 normal, no murmur, rub   or gallop     Abdomen:     Soft, non-tender, nondistended, normoactive bowel sounds,    no masses, no hepatosplenomegaly  Genitalia:    Normal male external genitalia without lesions.  Testicles without masses.  No inguinal hernias.  Rectal:    Deferred guaiac cards given  Extremities:   No clubbing, cyanosis or edema  Pulses:   2+ and symmetric all extremities  Skin:   Skin color, texture, turgor  normal, no rashes or lesions  Lymph nodes:   Cervical, supraclavicular, and axillary nodes normal  Neurologic:   CNII-XII intact, normal strength, sensation and gait; reflexes 2+ and symmetric throughout          Psych:   Normal mood, affect, hygiene and grooming.          Assessment & Plan:  Anxiety  Gastroesophageal reflux disease, esophagitis presence not specified  History of kidney stones  Former smoker  Class 1 obesity without serious comorbidity with body mass index (BMI) of 33.0 to 33.9 in adult, unspecified obesity type Discussed his weight with him in regard to lifestyle modification.  Recommend 20 minutes of something physical daily or 150 minutes a week.  Also discussed cutting back on carbohydrates.

## 2017-10-14 NOTE — Patient Instructions (Signed)
20 minutes of something physical every day or 150 minutes a week of something  Look at her eating habits in terms of carbohydrates and the easiest way to remember that is white food .  Bread, rice, pasta, potatoes and sugar.  Cut what ever you are eating in half

## 2017-10-15 LAB — COMPREHENSIVE METABOLIC PANEL
ALT: 26 IU/L (ref 0–44)
AST: 21 IU/L (ref 0–40)
Albumin/Globulin Ratio: 2.1 (ref 1.2–2.2)
Albumin: 4.8 g/dL (ref 3.5–5.5)
Alkaline Phosphatase: 51 IU/L (ref 39–117)
BUN/Creatinine Ratio: 10 (ref 9–20)
BUN: 11 mg/dL (ref 6–24)
Bilirubin Total: 0.4 mg/dL (ref 0.0–1.2)
CO2: 23 mmol/L (ref 20–29)
Calcium: 9.5 mg/dL (ref 8.7–10.2)
Chloride: 101 mmol/L (ref 96–106)
Creatinine, Ser: 1.14 mg/dL (ref 0.76–1.27)
GFR calc Af Amer: 92 mL/min/{1.73_m2} (ref >59)
GFR calc non Af Amer: 80 mL/min/{1.73_m2} (ref >59)
Globulin, Total: 2.3 g/dL (ref 1.5–4.5)
Glucose: 128 mg/dL — ABNORMAL HIGH (ref 65–99)
Potassium: 4.2 mmol/L (ref 3.5–5.2)
Sodium: 139 mmol/L (ref 134–144)
Total Protein: 7.1 g/dL (ref 6.0–8.5)

## 2017-10-15 LAB — LIPID PANEL
Chol/HDL Ratio: 4.7 ratio (ref 0.0–5.0)
Cholesterol, Total: 224 mg/dL — ABNORMAL HIGH (ref 100–199)
HDL: 48 mg/dL (ref >39)
LDL Calculated: 147 mg/dL — ABNORMAL HIGH (ref 0–99)
Triglycerides: 147 mg/dL (ref 0–149)
VLDL Cholesterol Cal: 29 mg/dL (ref 5–40)

## 2017-10-15 LAB — CBC WITH DIFFERENTIAL/PLATELET
Basophils Absolute: 0.1 10*3/uL (ref 0.0–0.2)
Basos: 1 %
EOS (ABSOLUTE): 0.1 10*3/uL (ref 0.0–0.4)
Eos: 1 %
Hematocrit: 43.9 % (ref 37.5–51.0)
Hemoglobin: 15.2 g/dL (ref 13.0–17.7)
Immature Grans (Abs): 0 10*3/uL (ref 0.0–0.1)
Immature Granulocytes: 0 %
Lymphocytes Absolute: 2.1 10*3/uL (ref 0.7–3.1)
Lymphs: 26 %
MCH: 31.3 pg (ref 26.6–33.0)
MCHC: 34.6 g/dL (ref 31.5–35.7)
MCV: 91 fL (ref 79–97)
Monocytes Absolute: 0.5 10*3/uL (ref 0.1–0.9)
Monocytes: 7 %
Neutrophils Absolute: 5.3 10*3/uL (ref 1.4–7.0)
Neutrophils: 65 %
Platelets: 336 10*3/uL (ref 150–450)
RBC: 4.85 x10E6/uL (ref 4.14–5.80)
RDW: 13 % (ref 12.3–15.4)
WBC: 8.2 10*3/uL (ref 3.4–10.8)

## 2017-10-31 ENCOUNTER — Ambulatory Visit
Admission: EM | Admit: 2017-10-31 | Discharge: 2017-10-31 | Disposition: A | Discharge status: Home / Self Care | Payer: 59 | Attending: Family Medicine | Admitting: Family Medicine

## 2017-10-31 ENCOUNTER — Other Ambulatory Visit: Payer: Self-pay

## 2017-10-31 DIAGNOSIS — L02411 Cutaneous abscess of right axilla: Secondary | ICD-10-CM | POA: Diagnosis not present

## 2017-10-31 MED ORDER — DOXYCYCLINE HYCLATE 100 MG PO TBEC: 100.0000 mg | DELAYED_RELEASE_TABLET | Freq: Two times a day (BID) | ORAL | 0 refills | Status: DC

## 2017-10-31 NOTE — Discharge Instructions (Signed)
As discussed, warm compresses, start doxycycline. Probiotics.    After 1 to two doses of doxycycline, expect clinical improvement.  Avoid the sun on this medication.   If no improvement on antibiotic, please return here or to your PCP for further evaluation.  Any new symptoms, or abscess appears to be more red, with more warmth, please do return for further evaluation.

## 2017-10-31 NOTE — ED Provider Notes (Signed)
[facility]    CSN: [id number] Arrival date & time: 10/31/17  0859     History   Chief Complaint Chief Complaint  Patient presents with  . Abscess    HPI GERROD MAULE is a 42 y.o. male.   CC: abcess right axilla x 1 day, improving.   Smaller one on left axilla which is not draining.   Draining clear fluid, blood from right axilla.  Notes he has been pressing it 'a lot trying to get fluid out.   Endorses nausea. No fever, vomiting.   Notes changed deotorant prior to abscess.    No known mrsa; wife works in [employer].      Past Medical History:  Diagnosis Date  . Calcium nephrolithiasis   . GERD (gastroesophageal reflux disease)     Patient Active Problem List   Diagnosis Date Noted  . Anxiety 12/26/2014  . GERD (gastroesophageal reflux disease) 10/29/2014  . History of kidney stones 04/13/2012    Past Surgical History:  Procedure Laterality Date  . HERNIA REPAIR     R inguinal  . KIDNEY STONE SURGERY     basket retrieval x several  . LITHOTRIPSY     x2       Home Medications    Prior to Admission medications   Medication Sig Start Date End Date Taking? Authorizing Provider  ALPRAZolam (XANAX) 0.25 MG tablet Take 1 tablet (0.25 mg total) by mouth at bedtime as needed for anxiety. 07/20/17   Ronnald Nian, MD  benzonatate (TESSALON PERLES) 100 MG capsule Take 1-2 capsules (100-200 mg total) by mouth every 8 (eight) hours as needed for cough. Patient not taking: Reported on 10/14/2017 02/25/17   Beau Fanny, FNP  cetirizine (ZYRTEC) 10 MG tablet Take 1 tablet (10 mg total) by mouth daily. Patient not taking: Reported on 02/17/2017 02/15/17   Candis Schatz, PA-C  doxycycline (DORYX) 100 MG EC tablet Take 1 tablet (100 mg total) by mouth 2 (two) times daily. 10/31/17   Allegra Grana, FNP  ondansetron (ZOFRAN ODT) 4 MG disintegrating tablet Take 1 tablet (4 mg total) by mouth every 8 (eight) hours as needed for nausea or  vomiting. Patient not taking: Reported on 10/14/2017 02/17/17   Hetty Blend L, NP-C  predniSONE (DELTASONE) 5 MG tablet Take 1 tablet (5 mg total) by mouth as directed. Patient not taking: Reported on 10/14/2017 02/25/17   Beau Fanny, FNP  dexlansoprazole (DEXILANT) 60 MG capsule Take 1 capsule (60 mg total) by mouth daily. 09/27/14 10/10/14  Tysinger, Kermit Balo, PA-C  hyoscyamine (LEVBID) 0.375 MG 12 hr tablet Take 1 tablet (0.375 mg total) by mouth 2 (two) times daily. 10/05/14 10/10/14  Ronnald Nian, MD    Family History Family History  Problem Relation Age of Onset  . Allergies Maternal Grandfather   . Vision loss Maternal Grandfather   . Emphysema Maternal Grandfather   . Nephrolithiasis Maternal Grandfather   . Colon cancer Paternal Grandfather     Social History Social History   Tobacco Use  . Smoking status: Former Smoker    Last attempt to quit: 05/04/2008    Years since quitting: 9.4  . Smokeless tobacco: Never Used  Substance Use Topics  . Alcohol use: No  . Drug use: No     Allergies   Contrast media [iodinated diagnostic agents]; Penicillins; and Sulfa antibiotics   Review of Systems Review of Systems  Constitutional: Negative for chills and fever.  Respiratory: Negative  for cough.   Cardiovascular: Negative for chest pain and palpitations.  Gastrointestinal: Positive for nausea. Negative for diarrhea and vomiting.  Skin: Positive for wound.     Physical Exam Triage Vital Signs ED Triage Vitals  Enc Vitals Group     BP 10/31/17 0908 (!) 138/100     Pulse Rate 10/31/17 0908 95     Resp 10/31/17 0908 18     Temp 10/31/17 0908 98.5 F (36.9 C)     Temp Source 10/31/17 0908 Oral     SpO2 10/31/17 0908 98 %     Weight 10/31/17 0909 213 lb (96.6 kg)     Height 10/31/17 0909 5\' 7"  (1.702 m)     Head Circumference --      Peak Flow --      Pain Score 10/31/17 0909 5     Pain Loc --      Pain Edu? --      Excl. in GC? --    No data found.  Updated  Vital Signs BP (!) 138/100 (BP Location: Left Arm)   Pulse 95   Temp 98.5 F (36.9 C) (Oral)   Resp 18   Ht 5\' 7"  (1.702 m)   Wt 213 lb (96.6 kg)   SpO2 98%   BMI 33.36 kg/m   Visual Acuity Right Eye Distance:   Left Eye Distance:   Bilateral Distance:    Right Eye Near:   Left Eye Near:    Bilateral Near:     Physical Exam  Constitutional: He appears well-developed and well-nourished.  Cardiovascular: Regular rhythm and normal heart sounds.  Pulmonary/Chest: Effort normal and breath sounds normal. No respiratory distress. He has no wheezes. He has no rhonchi. He has no rales.  Lymphadenopathy:       Head (left side): No submandibular and no preauricular adenopathy present.  Neurological: He is alert.  Skin: Skin is warm and dry.  Psychiatric: He has a normal mood and affect. His speech is normal and behavior is normal.  Vitals reviewed.   Patient does not have previous history of MRSA.  Patient does not have diabetes.   The patient gave informed consent for the procedure. Patient and I discussed risks, benefits, and alternatives to I & D.  Including risk of infection from laceration, localized pain, and bleeding. We discussed the option for oral antibiotics. Patient verbalized understanding to conversation and all questions were answered. We jointly decided to proceed with procedure.  On exam, there is a 5 cm indurated abscess under right axilla.  It is tender to palpation.  There is erythema . Spontaneously draining blood, serous fluid. Scant purulent discharge.  Approx 2 cm papule noted left axilla which is non fluctuant. It is non draining.  The patient gave informed consent for the procedure. The area was prepped with antiseptic solution. The area was anesthetized using lidocaine with epinephrine. The patient tolerated the anesthetic well.    #11 blade was used to incise the abscess.  Purulent bloody drainage was noted.  Bleeding from the wound was controlled by  applying direct pressure with gauze.   Iodoform was used to pack the abscess.  A bandage was placed.  Wound culture was obtained.  The patient tolerated the procedure well.  Extensive instructions were given to the patient.   Complications: None  Take antibiotic as directed.  Follow-up for wound check in 2-3 days.   UC Treatments / Results  Labs (all labs ordered are listed, but only abnormal  results are displayed) Labs Reviewed  AEROBIC CULTURE (SUPERFICIAL SPECIMEN)    EKG None  Radiology No results found.  Procedures Procedures (including critical care time)  Medications Ordered in UC Medications - No data to display  Initial Impression / Assessment and Plan / UC Course  I have reviewed the triage vital signs and the nursing notes.  Pertinent labs & imaging results that were available during my care of the patient were reviewed by me and considered in my medical decision making (see chart for details).     * Final Clinical Impressions(s) / UC Diagnoses   Final diagnoses:  Abscess of axilla, right  Patient is non toxic in appearance. Tolerated I & D well. Will start doxycycline for empiric MRSA coverage. Strict return precautions given.    Discharge Instructions     As discussed, warm compresses, start doxycycline. Probiotics.    After 1 to two doses of doxycycline, expect clinical improvement.  Avoid the sun on this medication.   If no improvement on antibiotic, please return here or to your PCP for further evaluation.  Any new symptoms, or abscess appears to be more red, with more warmth, please do return for further evaluation.    ED Prescriptions    Medication Sig Dispense Auth. Provider   doxycycline (DORYX) 100 MG EC tablet Take 1 tablet (100 mg total) by mouth 2 (two) times daily. 20 tablet Allegra Grana, FNP     Controlled Substance Prescriptions [place] Controlled Substance Registry consulted? Not Applicable   Allegra Grana,  [place] 10/31/17 [phone]

## 2017-10-31 NOTE — ED Triage Notes (Signed)
Pt with right axillary abscess starting on Friday and much smaller on on left axilla. States one under right arm has been draining some blood.

## 2017-11-02 ENCOUNTER — Other Ambulatory Visit: Payer: Self-pay | Admitting: Internal Medicine

## 2017-11-02 DIAGNOSIS — Z22322 Carrier or suspected carrier of Methicillin resistant Staphylococcus aureus: Secondary | ICD-10-CM

## 2017-11-02 LAB — AEROBIC CULTURE  (SUPERFICIAL SPECIMEN)

## 2017-11-02 LAB — AEROBIC CULTURE W GRAM STAIN (SUPERFICIAL SPECIMEN): Special Requests: NORMAL

## 2017-11-02 MED ORDER — MUPIROCIN 2 % EX OINT: 1.0000 "application " | TOPICAL_OINTMENT | Freq: Two times a day (BID) | CUTANEOUS | 0 refills | Status: DC

## 2017-11-09 LAB — HGB A1C W/O EAG: Hgb A1c MFr Bld: 5.1 % (ref 4.8–5.6)

## 2017-11-09 LAB — SPECIMEN STATUS REPORT

## 2018-08-05 ENCOUNTER — Encounter: Payer: Self-pay | Admitting: Family Medicine

## 2018-08-05 DIAGNOSIS — F419 Anxiety disorder, unspecified: Secondary | ICD-10-CM

## 2018-08-05 MED ORDER — ALPRAZOLAM 0.25 MG PO TABS: 0.2500 mg | ORAL_TABLET | Freq: Every evening | ORAL | 0 refills | Status: DC | PRN

## 2019-04-04 ENCOUNTER — Encounter: Payer: Self-pay | Admitting: Family Medicine

## 2019-04-04 ENCOUNTER — Ambulatory Visit: Payer: 59 | Admitting: Family Medicine

## 2019-04-04 ENCOUNTER — Other Ambulatory Visit: Payer: Self-pay

## 2019-04-04 VITALS — Temp 100.5°F | Wt 213.0 lb

## 2019-04-04 DIAGNOSIS — U071 COVID-19: Secondary | ICD-10-CM

## 2019-04-04 NOTE — Progress Notes (Signed)
   Subjective:    Patient ID: Arthur Ho, male    DOB: 07-27-1975, 43 y.o.   MRN: [id number]  HPI Documentation for virtual telephone encounter. Documentation for virtual audio and video telecommunications through [place] encounter:  The patient was located at home. The provider was located in the office. The patient did consent to this visit and is aware of possible charges through their insurance for this visit. The other persons participating in this telemedicine service were none. This virtual service is not related to other E/M service within previous 7 days. He states that last Thursday he developed a cough and lost his sense of taste and smell.  On Thursday he developed a fever as well as malaise.  He did get better over the weekend but then today he has noted some chest tightness, anorexia and some nausea.  One of his children also has a rhinorrhea.   Review of Systems     Objective:   Physical Exam Alert and in no distress otherwise not examined       Assessment & Plan:  Clinical diagnosis of COVID-19 I explained that it does indeed sound like Covid however he is down the road a week since it started and as long as he continues to improve, the therapy would be the same.  He was comfortable with that although I did recommend getting tested.  I explained that his son could easily have it but at this time they are all being sequestered due to school and his job is now work at home.  Essentially the diagnosis would not change the present treatment.  His wife does work at the hospital and is using PPE as well as being tested regularly.  At this point we will treat him conservatively with symptomatic care.  He was comfortable with that.  Did recommend using Tylenol and Prilosec.

## 2019-04-14 ENCOUNTER — Encounter: Payer: Self-pay | Admitting: Family Medicine

## 2019-04-14 ENCOUNTER — Other Ambulatory Visit: Payer: Self-pay

## 2019-04-14 ENCOUNTER — Ambulatory Visit (INDEPENDENT_AMBULATORY_CARE_PROVIDER_SITE_OTHER): Payer: 59 | Admitting: Family Medicine

## 2019-04-14 VITALS — Temp 98.7°F | Wt 190.0 lb

## 2019-04-14 DIAGNOSIS — U071 COVID-19: Secondary | ICD-10-CM | POA: Diagnosis not present

## 2019-04-14 DIAGNOSIS — G4700 Insomnia, unspecified: Secondary | ICD-10-CM | POA: Diagnosis not present

## 2019-04-14 DIAGNOSIS — F419 Anxiety disorder, unspecified: Secondary | ICD-10-CM

## 2019-04-14 MED ORDER — ALPRAZOLAM 0.25 MG PO TABS: 0.2500 mg | ORAL_TABLET | Freq: Every evening | ORAL | 0 refills | Status: DC | PRN

## 2019-04-14 MED ORDER — ZOLPIDEM TARTRATE 10 MG PO TABS: 10.0000 mg | ORAL_TABLET | Freq: Every evening | ORAL | 1 refills | Status: DC | PRN

## 2019-04-14 NOTE — Progress Notes (Signed)
   Subjective:    Patient ID: Arthur Ho, male    DOB: 1976-04-27, 44 y.o.   MRN: [id number]  HPI Documentation for virtual telephone encounter.  Documentation for virtual audio and video telecommunications through [place] encounter: The patient was located at home. The provider was located in the office. The patient did consent to this visit and is aware of possible charges through their insurance for this visit. The other persons participating in this telemedicine service were none. This virtual service is not related to other E/M service within previous 7 days. He developed Covid-like symptoms on November 26 and has essentially treated himself as being Covid positive.  He initially had difficulty with cough, loss of sense of taste and smell and then developed a fever with some malaise.  Last Monday and Tuesday he did feel better but in the last couple of days he has noted difficulty with fatigue, brain fog, decreased appetite, insomnia.  He states that he has lost a significant amount of weight due to anorexia.  He is having some difficulty with anxiety.  He has had no fever or shortness of breath and only occasional dry cough.    Review of Systems     Objective:   Physical Exam Alert and in no distress.  Normal breathing pattern noted.       Assessment & Plan:  Clinical diagnosis of COVID-19 - Plan: CBC with Differential, Comprehensive metabolic panel, Sedimentation rate  Anxiety - Plan: ALPRAZolam (XANAX) 0.25 MG tablet  Insomnia, unspecified type - Plan: zolpidem (AMBIEN) 10 MG tablet Although he does not have a positive test, his symptoms are consistent with Covid.  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  He would however like blood work drawn to make sure that there is nothing else going on.  I will also give him Ambien to  help with sleep and Xanax. He will come by the office and not leave his car.  His wife who is a phlebotomist will draw his blood.

## 2019-04-15 LAB — COMPREHENSIVE METABOLIC PANEL
ALT: 82 IU/L — ABNORMAL HIGH (ref 0–44)
AST: 34 IU/L (ref 0–40)
Albumin/Globulin Ratio: 2.1 (ref 1.2–2.2)
Albumin: 5 g/dL (ref 4.0–5.0)
Alkaline Phosphatase: 57 IU/L (ref 39–117)
BUN/Creatinine Ratio: 8 — ABNORMAL LOW (ref 9–20)
BUN: 8 mg/dL (ref 6–24)
Bilirubin Total: 0.6 mg/dL (ref 0.0–1.2)
CO2: 21 mmol/L (ref 20–29)
Calcium: 9.9 mg/dL (ref 8.7–10.2)
Chloride: 101 mmol/L (ref 96–106)
Creatinine, Ser: 1.01 mg/dL (ref 0.76–1.27)
GFR calc Af Amer: 106 mL/min/{1.73_m2} (ref >59)
GFR calc non Af Amer: 91 mL/min/{1.73_m2} (ref >59)
Globulin, Total: 2.4 g/dL (ref 1.5–4.5)
Glucose: 97 mg/dL (ref 65–99)
Potassium: 4.6 mmol/L (ref 3.5–5.2)
Sodium: 140 mmol/L (ref 134–144)
Total Protein: 7.4 g/dL (ref 6.0–8.5)

## 2019-04-15 LAB — CBC WITH DIFFERENTIAL/PLATELET
Basophils Absolute: 0.1 10*3/uL (ref 0.0–0.2)
Basos: 1 %
EOS (ABSOLUTE): 0.1 10*3/uL (ref 0.0–0.4)
Eos: 1 %
Hematocrit: 46.6 % (ref 37.5–51.0)
Hemoglobin: 16.2 g/dL (ref 13.0–17.7)
Immature Grans (Abs): 0 10*3/uL (ref 0.0–0.1)
Immature Granulocytes: 0 %
Lymphocytes Absolute: 1.4 10*3/uL (ref 0.7–3.1)
Lymphs: 16 %
MCH: 31.5 pg (ref 26.6–33.0)
MCHC: 34.8 g/dL (ref 31.5–35.7)
MCV: 91 fL (ref 79–97)
Monocytes Absolute: 0.9 10*3/uL (ref 0.1–0.9)
Monocytes: 11 %
Neutrophils Absolute: 5.9 10*3/uL (ref 1.4–7.0)
Neutrophils: 71 %
Platelets: 385 10*3/uL (ref 150–450)
RBC: 5.15 x10E6/uL (ref 4.14–5.80)
RDW: 11.8 % (ref 11.6–15.4)
WBC: 8.2 10*3/uL (ref 3.4–10.8)

## 2019-04-15 LAB — SEDIMENTATION RATE: Sed Rate: 17 mm/hr — ABNORMAL HIGH (ref 0–15)

## 2019-04-19 ENCOUNTER — Encounter: Payer: Self-pay | Admitting: Family Medicine

## 2019-05-11 ENCOUNTER — Encounter: Payer: Self-pay | Admitting: Family Medicine

## 2019-05-11 ENCOUNTER — Ambulatory Visit: Payer: 59 | Admitting: Family Medicine

## 2019-05-11 ENCOUNTER — Other Ambulatory Visit: Payer: Self-pay | Admitting: Family Medicine

## 2019-05-11 ENCOUNTER — Other Ambulatory Visit: Payer: Self-pay

## 2019-05-11 VITALS — Temp 98.0°F | Wt 190.0 lb

## 2019-05-11 DIAGNOSIS — L739 Follicular disorder, unspecified: Secondary | ICD-10-CM | POA: Diagnosis not present

## 2019-05-11 DIAGNOSIS — L0232 Furuncle of buttock: Secondary | ICD-10-CM | POA: Diagnosis not present

## 2019-05-11 MED ORDER — DOXYCYCLINE HYCLATE 100 MG PO TBEC: 100.0000 mg | DELAYED_RELEASE_TABLET | Freq: Two times a day (BID) | ORAL | 0 refills | Status: DC

## 2019-05-11 NOTE — Progress Notes (Signed)
   Subjective:    Patient ID: Arthur Ho, male    DOB: 1975-11-02, 44 y.o.   MRN: [id number]  HPI Documentation for virtual telephone encounter. Documentation for virtual audio and video telecommunications through [facility] encounter: The patient was located at home. The provider was located in the office. The patient did consent to this visit and is aware of possible charges through their insurance for this visit. The other persons participating in this telemedicine service were none. This virtual service is not related to other E/M service within previous 7 days. He has had difficulty recently with a boil that opened up and drained.  He has 2 lesions close to that that he has concerns about.  In the past he had used doxycycline.  No fever, chills or present drainage from that lesion.   Review of Systems     Objective:   Physical Exam Alert and in no distress.  He visually showed me the gluteal area which did show a healing abscess that is recently drained and 2 lesions near them that are approximately 1-1/2 cm in size that are erythematous.       Assessment & Plan:  Folliculitis - Plan: doxycycline (DORYX) 100 MG EC tablet  Boil of buttock I will give him doxycycline to help with the smaller lesions.  Recommend warm soaks to the boil to help it heal.  Explained the fact that he wants to try and keep it open so will drain from the inside out.  He will call if he has questions.

## 2019-05-11 NOTE — Telephone Encounter (Signed)
[facility] is requesting an alternative to the med that was sent in today. Please advise [facility]

## 2019-07-30 ENCOUNTER — Encounter: Payer: Self-pay | Admitting: Family Medicine

## 2019-07-30 DIAGNOSIS — F419 Anxiety disorder, unspecified: Secondary | ICD-10-CM

## 2019-07-31 MED ORDER — ALPRAZOLAM 0.25 MG PO TABS: 0.2500 mg | ORAL_TABLET | Freq: Every evening | ORAL | 0 refills | Status: DC | PRN

## 2019-08-10 DIAGNOSIS — H5213 Myopia, bilateral: Secondary | ICD-10-CM | POA: Diagnosis not present

## 2019-12-29 ENCOUNTER — Other Ambulatory Visit: Payer: Self-pay | Admitting: Medical

## 2019-12-29 ENCOUNTER — Encounter: Payer: Self-pay | Admitting: Family Medicine

## 2019-12-29 DIAGNOSIS — F419 Anxiety disorder, unspecified: Secondary | ICD-10-CM

## 2019-12-29 MED ORDER — ALPRAZOLAM 0.25 MG PO TABS: 0.2500 mg | ORAL_TABLET | Freq: Every evening | ORAL | 0 refills | Status: DC | PRN

## 2020-01-15 ENCOUNTER — Encounter: Payer: Self-pay | Admitting: Family Medicine

## 2020-01-15 ENCOUNTER — Telehealth: Payer: 59 | Admitting: Family Medicine

## 2020-01-15 ENCOUNTER — Other Ambulatory Visit: Payer: Self-pay

## 2020-01-15 VITALS — Temp 98.6°F | Ht 69.0 in | Wt 190.0 lb

## 2020-01-15 DIAGNOSIS — F410 Panic disorder [episodic paroxysmal anxiety] without agoraphobia: Secondary | ICD-10-CM

## 2020-01-15 DIAGNOSIS — F419 Anxiety disorder, unspecified: Secondary | ICD-10-CM

## 2020-01-15 NOTE — Progress Notes (Signed)
   Subjective:    Patient ID: Arthur Ho, male    DOB: 1975/09/14, 44 y.o.   MRN: [id number]  HPI I connected with  Arthur Ho on 01/15/20 by a video enabled telemedicine application and verified that I am speaking with the correct person using two identifiers.  Caregility use.  He was at home and I am in my office I discussed the limitations of evaluation and management by telemedicine. The patient expressed understanding and agreed to proceed. He complains of a 1 week history of intermittent leg twitching but no numbness, tingling, weakness, back pain he does tend to notice that this occurs more when he is anxious.  He has had difficulty with anxiety in the past and had seen Dr. Dellia Cloud which had helped.  He admits to becoming quite anxious and panicky more often than he would like.  Review of Systems     Objective:   Physical Exam Alert and in no distress otherwise not examined      Assessment & Plan:  Anxiety  Panic disorder Recommend he pay attention to the twitching sensation in regard to any factor including anxiety that might make this worse.  He is also to recontact Dr. Dellia Cloud and get back involved with that.  Discussed the possibility of using an SSRI to help with this and will defer that till a later point.  Recommend he set up an appointment for complete exam in sometime in the future.  He was comfortable with that.  20 minutes spent discussing this and in review of his record.

## 2020-01-25 MED ORDER — ALPRAZOLAM 0.25 MG PO TABS: 0.2500 mg | ORAL_TABLET | Freq: Every evening | ORAL | 0 refills | Status: DC | PRN

## 2020-02-02 ENCOUNTER — Ambulatory Visit (INDEPENDENT_AMBULATORY_CARE_PROVIDER_SITE_OTHER): Payer: 59 | Admitting: Family Medicine

## 2020-02-02 ENCOUNTER — Other Ambulatory Visit: Payer: Self-pay

## 2020-02-02 ENCOUNTER — Encounter: Payer: Self-pay | Admitting: Family Medicine

## 2020-02-02 VITALS — BP 122/80 | HR 87 | Temp 97.9°F | Wt 207.2 lb

## 2020-02-02 DIAGNOSIS — R002 Palpitations: Secondary | ICD-10-CM | POA: Diagnosis not present

## 2020-02-02 DIAGNOSIS — R5382 Chronic fatigue, unspecified: Secondary | ICD-10-CM | POA: Diagnosis not present

## 2020-02-02 DIAGNOSIS — M62838 Other muscle spasm: Secondary | ICD-10-CM | POA: Diagnosis not present

## 2020-02-02 DIAGNOSIS — F410 Panic disorder [episodic paroxysmal anxiety] without agoraphobia: Secondary | ICD-10-CM

## 2020-02-02 NOTE — Progress Notes (Signed)
   Subjective:    Patient ID: Arthur Ho, male    DOB: 07/31/1975, 44 y.o.   MRN: [id number]  HPI He has a month long history of difficulty with spasms mainly in his legs but he is now noticing also spasms in his arms.  He notices no numbness, tingling or falling or difficulty holding onto any things.  He also has a separate issue of chest fluttering sensation but he has not checked his pulse.   Review of Systems     Objective:   Physical Exam Alert and in no distress.  No visible fasciculations or spasms noted in his arms or legs.  Normal strength of arms and legs.  DTRs normal.  No clonus.  Myotactic reflexes normal.  Cardiac exam shows regular rhythm without murmurs or gallops.       Assessment & Plan:  Panic disorder  Muscle spasms of both lower extremities - Plan: CBC with Differential/Platelet, Comprehensive metabolic panel, VITAMIN D 25 Hydroxy (Vit-D Deficiency, Fractures)  Fluttering sensation of heart I would do basic screening on him.  At this point no major issue noted.  Instructed him on proper technique to check to see if he is truly having a fluttering sensation.

## 2020-02-03 LAB — CBC WITH DIFFERENTIAL/PLATELET
Basophils Absolute: 0.1 10*3/uL (ref 0.0–0.2)
Basos: 1 %
EOS (ABSOLUTE): 0.1 10*3/uL (ref 0.0–0.4)
Eos: 1 %
Hematocrit: 46.4 % (ref 37.5–51.0)
Hemoglobin: 16 g/dL (ref 13.0–17.7)
Immature Grans (Abs): 0 10*3/uL (ref 0.0–0.1)
Immature Granulocytes: 0 %
Lymphocytes Absolute: 1.8 10*3/uL (ref 0.7–3.1)
Lymphs: 23 %
MCH: 31.4 pg (ref 26.6–33.0)
MCHC: 34.5 g/dL (ref 31.5–35.7)
MCV: 91 fL (ref 79–97)
Monocytes Absolute: 0.7 10*3/uL (ref 0.1–0.9)
Monocytes: 8 %
Neutrophils Absolute: 5.4 10*3/uL (ref 1.4–7.0)
Neutrophils: 67 %
Platelets: 341 10*3/uL (ref 150–450)
RBC: 5.1 x10E6/uL (ref 4.14–5.80)
RDW: 11.9 % (ref 11.6–15.4)
WBC: 8.1 10*3/uL (ref 3.4–10.8)

## 2020-02-03 LAB — VITAMIN D 25 HYDROXY (VIT D DEFICIENCY, FRACTURES): Vit D, 25-Hydroxy: 26 ng/mL — ABNORMAL LOW (ref 30.0–100.0)

## 2020-02-03 LAB — COMPREHENSIVE METABOLIC PANEL
ALT: 20 IU/L (ref 0–44)
AST: 16 IU/L (ref 0–40)
Albumin/Globulin Ratio: 2 (ref 1.2–2.2)
Albumin: 5 g/dL (ref 4.0–5.0)
Alkaline Phosphatase: 57 IU/L (ref 44–121)
BUN/Creatinine Ratio: 15 (ref 9–20)
BUN: 16 mg/dL (ref 6–24)
Bilirubin Total: 0.4 mg/dL (ref 0.0–1.2)
CO2: 25 mmol/L (ref 20–29)
Calcium: 9.8 mg/dL (ref 8.7–10.2)
Chloride: 103 mmol/L (ref 96–106)
Creatinine, Ser: 1.04 mg/dL (ref 0.76–1.27)
GFR calc Af Amer: 101 mL/min/{1.73_m2} (ref >59)
GFR calc non Af Amer: 88 mL/min/{1.73_m2} (ref >59)
Globulin, Total: 2.5 g/dL (ref 1.5–4.5)
Glucose: 89 mg/dL (ref 65–99)
Potassium: 4.9 mmol/L (ref 3.5–5.2)
Sodium: 142 mmol/L (ref 134–144)
Total Protein: 7.5 g/dL (ref 6.0–8.5)

## 2020-02-08 LAB — SPECIMEN STATUS REPORT

## 2020-02-08 LAB — TSH: TSH: 1.02 u[IU]/mL (ref 0.450–4.500)

## 2020-02-09 ENCOUNTER — Telehealth: Payer: Self-pay | Admitting: Family Medicine

## 2020-02-09 NOTE — Telephone Encounter (Signed)
Pt call Kimberly back. Pt states he has been in contact with [facility] concerning labs.

## 2020-02-15 MED ORDER — ALPRAZOLAM 0.25 MG PO TABS: 0.2500 mg | ORAL_TABLET | Freq: Every evening | ORAL | 0 refills | Status: DC | PRN

## 2020-02-15 NOTE — Addendum Note (Signed)
Addended by: Sharlot Gowda C on: 02/15/2020 01:00 PM   Modules accepted: Orders

## 2020-02-19 ENCOUNTER — Ambulatory Visit (INDEPENDENT_AMBULATORY_CARE_PROVIDER_SITE_OTHER): Payer: 59 | Admitting: Psychology

## 2020-02-19 DIAGNOSIS — F411 Generalized anxiety disorder: Secondary | ICD-10-CM

## 2020-02-21 ENCOUNTER — Other Ambulatory Visit: Payer: Self-pay

## 2020-02-21 ENCOUNTER — Ambulatory Visit: Payer: 59 | Admitting: Family Medicine

## 2020-02-21 VITALS — BP 110/72 | HR 78 | Temp 97.9°F | Wt 203.4 lb

## 2020-02-21 DIAGNOSIS — M62838 Other muscle spasm: Secondary | ICD-10-CM

## 2020-02-21 DIAGNOSIS — R002 Palpitations: Secondary | ICD-10-CM | POA: Diagnosis not present

## 2020-02-21 DIAGNOSIS — F419 Anxiety disorder, unspecified: Secondary | ICD-10-CM | POA: Diagnosis not present

## 2020-02-21 NOTE — Progress Notes (Signed)
° °  Subjective:    Patient ID: Arthur Ho, male    DOB: 24-Mar-1976, 44 y.o.   MRN: [id number]  HPI He is here for recheck.  He still feels weakness in his legs as well as a fluttering sensation.  He has started in counseling with Asencion Islam.   Review of Systems     Objective:   Physical Exam Alert and in no distress.  No palpable tenderness to his thighs.  DTRs are normal.  Strength is normal.       Assessment & Plan:  Muscle spasms of both lower extremities - Plan: CK, High sensitivity CRP, Magnesium, Ferritin  Fluttering sensation of heart  Anxiety If the results come out negative, I will refer to neurology to get another opinion on his symptoms.

## 2020-02-22 ENCOUNTER — Other Ambulatory Visit: Payer: Self-pay

## 2020-02-22 DIAGNOSIS — M62838 Other muscle spasm: Secondary | ICD-10-CM

## 2020-02-22 LAB — FERRITIN: Ferritin: 174 ng/mL (ref 30–400)

## 2020-02-22 LAB — CK: Total CK: 80 U/L (ref 49–439)

## 2020-02-22 LAB — HIGH SENSITIVITY CRP: CRP, High Sensitivity: 1.36 mg/L (ref 0.00–3.00)

## 2020-02-22 LAB — MAGNESIUM: Magnesium: 2.1 mg/dL (ref 1.6–2.3)

## 2020-02-27 MED ORDER — ALPRAZOLAM 0.25 MG PO TABS: 0.2500 mg | ORAL_TABLET | Freq: Every evening | ORAL | 0 refills | Status: DC | PRN

## 2020-02-27 NOTE — Addendum Note (Signed)
Addended by: Ronnald Nian on: 02/27/2020 02:21 PM   Modules accepted: Orders

## 2020-03-04 ENCOUNTER — Ambulatory Visit (INDEPENDENT_AMBULATORY_CARE_PROVIDER_SITE_OTHER): Payer: 59 | Admitting: Psychology

## 2020-03-04 DIAGNOSIS — F411 Generalized anxiety disorder: Secondary | ICD-10-CM

## 2020-03-05 ENCOUNTER — Other Ambulatory Visit: Payer: Self-pay

## 2020-03-05 ENCOUNTER — Encounter: Payer: Self-pay | Admitting: Family Medicine

## 2020-03-05 ENCOUNTER — Telehealth: Payer: 59 | Admitting: Family Medicine

## 2020-03-05 VITALS — BP 122/80 | Wt 203.0 lb

## 2020-03-05 DIAGNOSIS — F419 Anxiety disorder, unspecified: Secondary | ICD-10-CM | POA: Diagnosis not present

## 2020-03-05 MED ORDER — SERTRALINE HCL 50 MG PO TABS: 50.0000 mg | ORAL_TABLET | Freq: Every day | ORAL | 1 refills | Status: DC

## 2020-03-05 NOTE — Progress Notes (Signed)
   Subjective:    Patient ID: Arthur Ho, male    DOB: 04-30-1976, 44 y.o.   MRN: [id number]  HPI I connected with  Arthur Ho on 03/05/20 by a video enabled telemedicine application and verified that I am speaking with the correct person using two identifiers.  Care duties used.  I am in my office and he is at home. I discussed the limitations of evaluation and management by telemedicine. The patient expressed understanding and agreed to proceed. He is now back in counseling with Caralyn Guile.  Message was left for Dr. Dellia Cloud to call me.  Arthur Ho is having continued difficulty with anxiety as well as perseverating on his aches and pains.  So far his work-up has been negative.  He does feel as if he might be slightly depressed.  There is also apparently a family history of anxiety.  He has been using CBD oil but is not sure is doing much Ho.   Review of Systems     Objective:   Physical Exam Alert and in no distress with appropriate affect.      Assessment & Plan:  Anxiety - Plan: sertraline (ZOLOFT) 50 MG tablet I will start him on 50 mg as I do think he is having anxiety and some depression.Marland Kitchen  He is to call me in 1 week and leave a message on my chart.  Adjust medications based on my next MyChart message from him.  Phone message left for Dr. Dellia Cloud to call me.  12 minutes spent in consultation with him.

## 2020-03-11 MED ORDER — CITALOPRAM HYDROBROMIDE 20 MG PO TABS: 20.0000 mg | ORAL_TABLET | Freq: Every day | ORAL | 3 refills | Status: DC

## 2020-03-11 NOTE — Addendum Note (Signed)
Addended by: Ronnald Nian on: 03/11/2020 12:25 PM   Modules accepted: Orders

## 2020-03-13 MED ORDER — CITALOPRAM HYDROBROMIDE 20 MG PO TABS: 20.0000 mg | ORAL_TABLET | Freq: Every day | ORAL | 3 refills | Status: DC

## 2020-03-13 NOTE — Addendum Note (Signed)
Addended by: Ronnald Nian on: 03/13/2020 04:25 PM   Modules accepted: Orders

## 2020-03-20 MED ORDER — VORTIOXETINE HBR 5 MG PO TABS: 5.0000 mg | ORAL_TABLET | Freq: Every day | ORAL | 0 refills | Status: DC

## 2020-03-20 NOTE — Addendum Note (Signed)
Addended by: Ronnald Nian on: 03/20/2020 09:17 AM   Modules accepted: Orders

## 2020-03-20 NOTE — Telephone Encounter (Signed)
He has not responded well to SSRIs and I will therefore try him on Trintellix.  If that does not work, I will probably refer to psychiatry.

## 2020-03-26 ENCOUNTER — Ambulatory Visit (INDEPENDENT_AMBULATORY_CARE_PROVIDER_SITE_OTHER): Payer: 59 | Admitting: Psychology

## 2020-03-26 DIAGNOSIS — F411 Generalized anxiety disorder: Secondary | ICD-10-CM

## 2020-04-09 ENCOUNTER — Ambulatory Visit (INDEPENDENT_AMBULATORY_CARE_PROVIDER_SITE_OTHER): Payer: 59 | Admitting: Psychology

## 2020-04-09 DIAGNOSIS — F411 Generalized anxiety disorder: Secondary | ICD-10-CM

## 2020-04-15 ENCOUNTER — Other Ambulatory Visit: Payer: Self-pay | Admitting: Family Medicine

## 2020-04-15 DIAGNOSIS — F419 Anxiety disorder, unspecified: Secondary | ICD-10-CM

## 2020-04-16 MED ORDER — ALPRAZOLAM 0.25 MG PO TABS: 0.2500 mg | ORAL_TABLET | Freq: Every evening | ORAL | 0 refills | Status: DC | PRN

## 2020-04-16 NOTE — Telephone Encounter (Signed)
[facility]. Please advise [facility] 

## 2020-04-23 ENCOUNTER — Ambulatory Visit (INDEPENDENT_AMBULATORY_CARE_PROVIDER_SITE_OTHER): Payer: 59 | Admitting: Psychology

## 2020-04-23 DIAGNOSIS — F411 Generalized anxiety disorder: Secondary | ICD-10-CM | POA: Diagnosis not present

## 2020-05-10 ENCOUNTER — Ambulatory Visit: Payer: 59 | Admitting: Neurology

## 2020-05-29 ENCOUNTER — Ambulatory Visit (INDEPENDENT_AMBULATORY_CARE_PROVIDER_SITE_OTHER): Payer: 59 | Admitting: Psychology

## 2020-05-29 DIAGNOSIS — F411 Generalized anxiety disorder: Secondary | ICD-10-CM | POA: Diagnosis not present

## 2020-06-04 ENCOUNTER — Ambulatory Visit: Payer: 59 | Admitting: Neurology

## 2020-06-07 ENCOUNTER — Other Ambulatory Visit: Payer: Self-pay | Admitting: Family Medicine

## 2020-06-07 DIAGNOSIS — F419 Anxiety disorder, unspecified: Secondary | ICD-10-CM

## 2020-06-07 MED ORDER — ALPRAZOLAM 0.25 MG PO TABS: 0.2500 mg | ORAL_TABLET | Freq: Every evening | ORAL | 0 refills | Status: DC | PRN

## 2020-06-07 NOTE — Telephone Encounter (Signed)
[facility]. Please advise [facility] 

## 2020-06-12 ENCOUNTER — Ambulatory Visit (INDEPENDENT_AMBULATORY_CARE_PROVIDER_SITE_OTHER): Payer: 59 | Admitting: Psychology

## 2020-06-12 DIAGNOSIS — F411 Generalized anxiety disorder: Secondary | ICD-10-CM

## 2020-06-26 ENCOUNTER — Ambulatory Visit (INDEPENDENT_AMBULATORY_CARE_PROVIDER_SITE_OTHER): Payer: 59 | Admitting: Psychology

## 2020-06-26 DIAGNOSIS — F411 Generalized anxiety disorder: Secondary | ICD-10-CM

## 2020-07-10 ENCOUNTER — Ambulatory Visit (INDEPENDENT_AMBULATORY_CARE_PROVIDER_SITE_OTHER): Payer: 59 | Admitting: Psychology

## 2020-07-10 DIAGNOSIS — F411 Generalized anxiety disorder: Secondary | ICD-10-CM | POA: Diagnosis not present

## 2020-07-12 ENCOUNTER — Other Ambulatory Visit: Payer: Self-pay

## 2020-07-12 ENCOUNTER — Ambulatory Visit: Payer: 59 | Admitting: Medical

## 2020-07-12 ENCOUNTER — Encounter: Payer: Self-pay | Admitting: Medical

## 2020-07-12 VITALS — BP 116/88 | HR 71 | Ht 68.5 in | Wt 205.2 lb

## 2020-07-12 DIAGNOSIS — R002 Palpitations: Secondary | ICD-10-CM

## 2020-07-12 DIAGNOSIS — Z8249 Family history of ischemic heart disease and other diseases of the circulatory system: Secondary | ICD-10-CM

## 2020-07-12 DIAGNOSIS — R0683 Snoring: Secondary | ICD-10-CM

## 2020-07-12 NOTE — Progress Notes (Signed)
Subjective:  Arthur Ho is a 45 y.o. male who presents for Chief Complaint  Patient presents with  . Palpitations    Symptoms on and off over the past few months      Here for c/o palpitations.  Normally sees Dr. Susann Givens here  He notes he has had palpitations on and off in the past.  Lately having a more frequently.  They can last 10 seconds to 10 minutes.  Sometimes he catches his breath where it really is noticeable.  He denies associated shortness of breath, chest pain, dizziness or syncope.  Otherwise normal state of health.  He does snore.  His wife has raised concern about possible sleep problems /sleep apnea.  He only drinks 1 caffeinated drink daily.  He does have some stress but he thinks the palpitations are not attributed to that  His father had A. fib and brother had to have an ablation due to arrhythmia.  There is no known sleep apnea in the family  His wife is a Chief Strategy Officer at [employer]  No other aggravating or relieving factors.    No other c/o.  Past Medical History:  Diagnosis Date  . Calcium nephrolithiasis   . GERD (gastroesophageal reflux disease)    Family History  Problem Relation Age of Onset  . Allergies Maternal Grandfather   . Vision loss Maternal Grandfather   . Emphysema Maternal Grandfather   . Nephrolithiasis Maternal Grandfather   . Colon cancer Paternal Grandfather   . Atrial fibrillation Father   . Arrhythmia Brother    Current Outpatient Medications on File Prior to Visit  Medication Sig Dispense Refill  . ALPRAZolam (XANAX) 0.25 MG tablet Take 1 tablet (0.25 mg total) by mouth at bedtime as needed for anxiety. 30 tablet 0  . vortioxetine HBr (TRINTELLIX) 5 MG TABS tablet Take 1 tablet (5 mg total) by mouth daily. (Patient not taking: Reported on 07/12/2020) 7 tablet 0   No current facility-administered medications on file prior to visit.     The following portions of the patient's history were reviewed and  updated as appropriate: allergies, current medications, past family history, past medical history, past social history, past surgical history and problem list.  ROS Otherwise as in subjective above  Objective: BP 116/88   Pulse 71   Ht 5' 8.5" (1.74 m)   Wt 205 lb 3.2 oz (93.1 kg)   SpO2 95%   BMI 30.75 kg/m   General appearance: alert, no distress, well developed, well nourished Neck: supple, no lymphadenopathy, no thyromegaly, no masses Heart: RRR, normal S1, S2, no murmurs Lungs: CTA bilaterally, no wheezes, rhonchi, or rales Pulses: 2+ radial pulses, 2+ pedal pulses, normal cap refill Ext: no edema   EKG:, Indication palpitations, rate 69 bpm, PR 122 ms, QRS 76 ms, QTC 409 ms, axis 25 degrees, normal sinus rhythm     Assessment: Encounter Diagnoses  Name Primary?  . Palpitation Yes  . Family history of arrhythmia   . Snores      Plan: EKG today normal  I reviewed his labs from October 2021 which were normal except for vitamin D deficiency.  He is compliant with vitamin D supplement  We discussed his palpitations and family history of arrhythmia including brother that had an ablation.  We also discussed that he snores and has some symptoms that may suggest sleep apnea  We will pursue event monitor testing.  We have a new vendor that is supposed to come in  next week with a 3 to 5-day event monitor he can take home and wear for testing.  He will call back if he has not heard back from [place] by next Wednesday.  If we cannot get that test soon enough we will refer to cardiology and let them do the event monitor  We also discussed sleep study.  He will talk to his wife and consider doing a sleep study   Arthur Ho was seen today for palpitations.  Diagnoses and all orders for this visit:  Palpitation -     EKG 12-Lead  Family history of arrhythmia -     EKG 12-Lead  Snores    Follow up: next week

## 2020-07-19 ENCOUNTER — Telehealth: Payer: Self-pay | Admitting: Medical

## 2020-07-19 NOTE — Telephone Encounter (Signed)
Patient called to check on status of heart monitors  Advised we do not have them in yet and I would have someone contact him on Monday

## 2020-07-20 NOTE — Telephone Encounter (Signed)
Ok, stay on top of this or we will have to refer to cardiology

## 2020-07-22 ENCOUNTER — Other Ambulatory Visit: Payer: Self-pay

## 2020-07-22 DIAGNOSIS — Z8249 Family history of ischemic heart disease and other diseases of the circulatory system: Secondary | ICD-10-CM

## 2020-07-22 DIAGNOSIS — R002 Palpitations: Secondary | ICD-10-CM

## 2020-07-22 NOTE — Telephone Encounter (Signed)
Is this referral to heart care or heart monitor?

## 2020-07-22 NOTE — Telephone Encounter (Signed)
Go ahead and refer to cardiology, not just event monitor.  It will be easier and simple just to do a cardiology referral

## 2020-07-22 NOTE — Telephone Encounter (Signed)
Referral has been placed. 

## 2020-07-24 ENCOUNTER — Ambulatory Visit (INDEPENDENT_AMBULATORY_CARE_PROVIDER_SITE_OTHER): Payer: 59 | Admitting: Psychology

## 2020-07-24 DIAGNOSIS — F411 Generalized anxiety disorder: Secondary | ICD-10-CM | POA: Diagnosis not present

## 2020-08-01 ENCOUNTER — Other Ambulatory Visit: Payer: Self-pay

## 2020-08-01 ENCOUNTER — Ambulatory Visit: Payer: 59 | Admitting: Cardiology

## 2020-08-01 ENCOUNTER — Encounter: Payer: Self-pay | Admitting: Cardiology

## 2020-08-01 VITALS — BP 131/92 | HR 65 | Ht 68.5 in | Wt 207.0 lb

## 2020-08-01 DIAGNOSIS — R002 Palpitations: Secondary | ICD-10-CM | POA: Insufficient documentation

## 2020-08-01 NOTE — Progress Notes (Signed)
Patient referred by Denita Lung, MD for palpitations  Subjective:   Arthur Ho, male    DOB: 1976-01-19, 45 y.o.   MRN: [id number]   Chief Complaint  Patient presents with  . Palpitations  . New Patient (Initial Visit)     HPI  45 y.o. 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 He exercises in the form of weight training and walking couple times a week. He denies chest pain, shortness of breath, leg edema, orthopnea, PND, TIA/syncope. However, he has had palpitations lasting for anywhere from few min to several hours couple times a week. Blood pressure elevated today, but usually normal.     Past Medical History:  Diagnosis Date  . Calcium nephrolithiasis   . GERD (gastroesophageal reflux disease)      Past Surgical History:  Procedure Laterality Date  . HERNIA REPAIR     R inguinal  . KIDNEY STONE SURGERY     basket retrieval x several  . LITHOTRIPSY     x2     Social History   Tobacco Use  Smoking Status Former Smoker  . Quit date: 05/04/2008  . Years since quitting: 12.2  Smokeless Tobacco Never Used    Social History   Substance and Sexual Activity  Alcohol Use No     Family History  Problem Relation Age of Onset  . Allergies Maternal Grandfather   . Vision loss Maternal Grandfather   . Emphysema Maternal Grandfather   . Nephrolithiasis Maternal Grandfather   . Colon cancer Paternal Grandfather   . Atrial fibrillation Father   . Arrhythmia Brother      Current Outpatient Medications on File Prior to Visit  Medication Sig Dispense Refill  . ALPRAZolam (XANAX) 0.25 MG tablet Take 1 tablet (0.25 mg total) by mouth at bedtime as needed for anxiety. 30 tablet 0  . vortioxetine HBr (TRINTELLIX) 5 MG TABS tablet Take 1 tablet (5 mg total) by mouth daily. (Patient not taking: Reported on 07/12/2020) 7 tablet 0   No current facility-administered medications on file prior to  visit.    Cardiovascular and other pertinent studies:  EKG 08/01/2020: Sinus rhythm 69 bpm with sinus arrhtymia Otherwise normal EKG   Recent labs: 02/02/2020: Glucose 89, BUN/Cr 16/1.04. EGFR 88. Na/K 142/4.9. Rest of the CMP normal H/H 16/46. MCV 91. Platelets 341 HbA1C N/A% Lipid panel N/A TSH 1.0 normal    Review of Systems  Cardiovascular: Positive for palpitations. Negative for chest pain, dyspnea on exertion, leg swelling and syncope.         Vitals:   08/01/20 1520 08/01/20 1527  BP: (!) 141/117 (!) 131/92  Pulse: 68 65  SpO2: 98% 96%     Body mass index is 31.02 kg/m. Filed Weights   08/01/20 1520  Weight: 207 lb (93.9 kg)     Objective:   Physical Exam Vitals and nursing note reviewed.  Constitutional:      General: He is not in acute distress. Neck:     Vascular: No JVD.  Cardiovascular:     Rate and Rhythm: Normal rate and regular rhythm.     Heart sounds: Normal heart sounds. No murmur heard.   Pulmonary:     Effort: Pulmonary effort is normal.     Breath sounds: Normal breath sounds. No wheezing or rales.  Musculoskeletal:     Right lower leg: No edema.     Left lower leg:  No edema.         Assessment & Recommendations:   45 y.o. Caucasian male with h/o anxiety, now with palpitations  Palpitations: EKG with physiological sinus arhythmia  Recommend 2 week cardiac telemetry.  If symptoms do not correlate with any significant arrhythmia, suspect anxiety to be the cause.   Further recommendations after above testing.  Thank you for referring the patient to [place]. Please feel free to contact with any questions.   Nigel Mormon, MD Pager: [phone] Office: [phone]

## 2020-08-02 ENCOUNTER — Other Ambulatory Visit: Payer: Self-pay

## 2020-08-02 ENCOUNTER — Ambulatory Visit: Payer: Self-pay | Admitting: Cardiology

## 2020-08-02 DIAGNOSIS — R002 Palpitations: Secondary | ICD-10-CM

## 2020-08-07 ENCOUNTER — Ambulatory Visit: Payer: 59 | Admitting: Psychology

## 2020-08-08 DIAGNOSIS — H5213 Myopia, bilateral: Secondary | ICD-10-CM | POA: Diagnosis not present

## 2020-08-12 ENCOUNTER — Telehealth: Payer: Self-pay | Admitting: Family Medicine

## 2020-08-12 ENCOUNTER — Telehealth: Payer: 59 | Admitting: Nurse Practitioner

## 2020-08-12 DIAGNOSIS — L03116 Cellulitis of left lower limb: Secondary | ICD-10-CM

## 2020-08-12 MED ORDER — CLINDAMYCIN HCL 300 MG PO CAPS: 300.0000 mg | ORAL_CAPSULE | Freq: Three times a day (TID) | ORAL | 0 refills | Status: DC

## 2020-08-12 NOTE — Progress Notes (Signed)
E Visit for Cellulitis  We are sorry that you are not feeling well. Here is how we plan to help!  Based on what you shared with me it looks like you have cellulitis.  Cellulitis looks like areas of skin redness, swelling, and warmth; it develops as a result of bacteria entering under the skin. Little red spots and/or bleeding can be seen in skin, and tiny surface sacs containing fluid can occur. Fever can be present. Cellulitis is almost always on one side of a body, and the lower limbs are the most common site of involvement.   I have prescribed:  Clindamycin 300 mg take one by mouth three times a day for 7 days  HOME CARE:  . Take your medications as ordered and take all of them, even if the skin irritation appears to be healing.   GET HELP RIGHT AWAY IF:  . Symptoms that don't begin to go away within 48 hours. . Severe redness persists or worsens . If the area turns color, spreads or swells. . If it blisters and opens, develops yellow-brown crust or bleeds. . You develop a fever or chills. . If the pain increases or becomes unbearable.  . Are unable to keep fluids and food down.  MAKE SURE YOU    Understand these instructions.  Will watch your condition.  Will get help right away if you are not doing well or get worse.  Thank you for choosing an e-visit. Your e-visit answers were reviewed by a board certified advanced clinical practitioner to complete your personal care plan. Depending upon the condition, your plan could have included both over the counter or prescription medications. Please review your pharmacy choice. Make sure the pharmacy is open so you can pick up prescription now. If there is a problem, you may contact your provider through [employer] and have the prescription routed to another pharmacy. Your safety is important to [place]. If you have drug allergies check your prescription carefully.  For the next 24 hours you can use MyChart to ask questions about  today's visit, request a non-urgent call back, or ask for a work or school excuse. You will get an email in the next two days asking about your experience. I hope that your e-visit has been valuable and will speed your recovery.   5-10 minutes spent reviewing and documenting in chart.

## 2020-08-12 NOTE — Telephone Encounter (Signed)
Pt has a boil on his knee and said he did a virtual with you in the past. Is it ok to schedule virtual or did you want him to come in?

## 2020-08-12 NOTE — Telephone Encounter (Signed)
Schedule an appointment.

## 2020-08-19 ENCOUNTER — Other Ambulatory Visit: Payer: Self-pay | Admitting: Family Medicine

## 2020-08-19 DIAGNOSIS — F419 Anxiety disorder, unspecified: Secondary | ICD-10-CM

## 2020-08-19 MED ORDER — ALPRAZOLAM 0.25 MG PO TABS: 0.2500 mg | ORAL_TABLET | Freq: Every evening | ORAL | 0 refills | Status: DC | PRN

## 2020-08-19 NOTE — Telephone Encounter (Signed)
Is this okay to refill? 

## 2020-08-21 ENCOUNTER — Ambulatory Visit (INDEPENDENT_AMBULATORY_CARE_PROVIDER_SITE_OTHER): Payer: 59 | Admitting: Psychology

## 2020-08-21 DIAGNOSIS — F411 Generalized anxiety disorder: Secondary | ICD-10-CM

## 2020-09-04 ENCOUNTER — Ambulatory Visit (INDEPENDENT_AMBULATORY_CARE_PROVIDER_SITE_OTHER): Payer: 59 | Admitting: Psychology

## 2020-09-04 DIAGNOSIS — F411 Generalized anxiety disorder: Secondary | ICD-10-CM

## 2020-09-13 ENCOUNTER — Encounter: Payer: Self-pay | Admitting: Radiology

## 2020-09-13 ENCOUNTER — Ambulatory Visit (INDEPENDENT_AMBULATORY_CARE_PROVIDER_SITE_OTHER): Payer: 59

## 2020-09-13 ENCOUNTER — Other Ambulatory Visit: Payer: Self-pay

## 2020-09-13 ENCOUNTER — Encounter: Payer: Self-pay | Admitting: Cardiovascular Disease

## 2020-09-13 ENCOUNTER — Ambulatory Visit: Payer: 59 | Admitting: Cardiovascular Disease

## 2020-09-13 DIAGNOSIS — R002 Palpitations: Secondary | ICD-10-CM

## 2020-09-13 DIAGNOSIS — G4733 Obstructive sleep apnea (adult) (pediatric): Secondary | ICD-10-CM | POA: Diagnosis not present

## 2020-09-13 NOTE — Assessment & Plan Note (Signed)
Mr. Arthur Ho was referred to me by Crosby Oyster, PA-C for palpitations.  He has had this for quite a while but they have become more noticeable and pronounced in the last couple months.  They can last from minutes to hours.  They occur on a daily basis.  There are no other associated symptoms.  He does drink 2 glasses of coffee a day.  His TSH has been normal in the past.  He does have symptoms compatible with obstructive sleep apnea.  I am going get a 2D echocardiogram, 2-week Zio patch and outpatient sleep study to further evaluate.

## 2020-09-13 NOTE — Addendum Note (Signed)
Addended by: Bea Laura B on: 09/13/2020 02:47 PM   Modules accepted: Orders

## 2020-09-13 NOTE — Progress Notes (Signed)
09/13/2020 Pauline Good   30-Sep-1975  [id number]  Primary Physician Ronnald Nian, MD Primary Cardiologist: Runell Gess MD Nicholes Calamity, [place]  HPI:  Arthur Ho is a 45 y.o. 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  He was referred by Crosby Oyster, PA-C for evaluation of palpitations.  He he is married to a CNA who works at the [employer].  He has no cardiac risk factors.  His last lipid profile however 10/14/2017 revealed total cholesterol 247 with an LDL of 147.  Both his father and brother have had A. fib and ablation.  He does have a generalized anxiety disorder followed by Dr. Caralyn Guile on as needed Xanax.  He said the Xanax does improve his palpitations.   Current Meds  Medication Sig  . ALPRAZolam (XANAX) 0.25 MG tablet Take 1 tablet (0.25 mg total) by mouth at bedtime as needed for anxiety.  . [DISCONTINUED] Cholecalciferol (VITAMIN D) 50 MCG (2000 UT) CAPS Take by mouth.  . [DISCONTINUED] clindamycin (CLEOCIN) 300 MG capsule Take 1 capsule (300 mg total) by mouth 3 (three) times daily.     Allergies  Allergen Reactions  . Contrast Media [Iodinated Diagnostic Agents] Palpitations    SOB  . Penicillins Anaphylaxis  . Sulfa Antibiotics     Social History   Socioeconomic History  . Marital status: Married    Spouse name: Not on file  . Number of children: 1  . Years of education: Not on file  . Highest education level: Not on file  Occupational History  . Occupation: Landscape architect: Optometrist  Tobacco Use  . Smoking status: Former Smoker    Quit date: 05/04/2008    Years since quitting: 12.3  . Smokeless tobacco: Never Used  Vaping Use  . Vaping Use: Never used  Substance and Sexual Activity  . Alcohol use: No  . Drug use: No  . Sexual activity: Yes  Other Topics Concern  . Not on file  Social History Narrative  . Not on file   Social  Determinants of Health   Financial Resource Strain: Not on file  Food Insecurity: Not on file  Transportation Needs: Not on file  Physical Activity: Not on file  Stress: Not on file  Social Connections: Not on file  Intimate Partner Violence: Not on file     Review of Systems: General: negative for chills, fever, night sweats or weight changes.  Cardiovascular: negative for chest pain, dyspnea on exertion, edema, orthopnea, palpitations, paroxysmal nocturnal dyspnea or shortness of breath Dermatological: negative for rash Respiratory: negative for cough or wheezing Urologic: negative for hematuria Abdominal: negative for nausea, vomiting, diarrhea, bright red blood per rectum, melena, or hematemesis Neurologic: negative for visual changes, syncope, or dizziness All other systems reviewed and are otherwise negative except as noted above.    Blood pressure 140/84, pulse 71, height 5\' 8"  (1.727 m), weight 206 lb 12.8 oz (93.8 kg), SpO2 98 %.  General appearance: alert and no distress Neck: no adenopathy, no carotid bruit, no JVD, supple, symmetrical, trachea midline and thyroid not enlarged, symmetric, no tenderness/mass/nodules Lungs: clear to auscultation bilaterally Heart: regular rate and rhythm, S1, S2 normal, no murmur, click, rub or gallop Extremities: extremities normal, atraumatic, no cyanosis or edema Pulses: 2+ and symmetric Skin: Skin color, texture, turgor normal. No rashes or lesions Neurologic: Alert and oriented X 3, normal strength  and tone. Normal symmetric reflexes. Normal coordination and gait  EKG sinus rhythm at 69 without ST or T wave changes.  I personally reviewed this EKG.  ASSESSMENT AND PLAN:   Palpitations Mr. Arthur Ho was referred to me by Crosby Oyster, PA-C for palpitations.  He has had this for quite a while but they have become more noticeable and pronounced in the last couple months.  They can last from minutes to hours.  They occur on a daily  basis.  There are no other associated symptoms.  He does drink 2 glasses of coffee a day.  His TSH has been normal in the past.  He does have symptoms compatible with obstructive sleep apnea.  I am going get a 2D echocardiogram, 2-week Zio patch and outpatient sleep study to further evaluate.  Obstructive sleep apnea Mr. Arthur Ho has nocturnal snoring, daytime somnolence and palpitations.  He does have episodes of apnea appreciated by his wife when they sleep.  I suspect that this may be contributing to his palpitations.  I am going to get a sleep study to further evaluate.      Runell Gess MD FACP,FACC,FAHA, [facility] 09/13/2020 2:40 PM

## 2020-09-13 NOTE — Assessment & Plan Note (Signed)
Arthur Ho has nocturnal snoring, daytime somnolence and palpitations.  He does have episodes of apnea appreciated by his wife when they sleep.  I suspect that this may be contributing to his palpitations.  I am going to get a sleep study to further evaluate.

## 2020-09-13 NOTE — Patient Instructions (Signed)
Medication Instructions:  No Changes In Medications at this time.  *If you need a refill on your cardiac medications before your next appointment, please call your pharmacy*  Please return for FASTING blood work- CHOLESTEROL AND LIVER FUNCTION- NO APPOINTMENT NEEDED.   Testing/Procedures: Your physician has requested that you have an echocardiogram. Echocardiography is a painless test that uses sound waves to create images of your heart. It provides your doctor with information about the size and shape of your heart and how well your heart's chambers and valves are working. You may receive an ultrasound enhancing agent through an IV if needed to better visualize your heart during the echo.This procedure takes approximately one hour. There are no restrictions for this procedure. This will take place at the [address].   Your physician has recommended that you have a sleep study. This test records several body functions during sleep, including: brain activity, eye movement, oxygen and carbon dioxide blood levels, heart rate and rhythm, breathing rate and rhythm, the flow of air through your mouth and nose, snoring, body muscle movements, and chest and belly movement.  ZIO XT- Long Term Monitor Instructions   Your physician has requested you wear your ZIO patch monitor 14 days.   This is a single patch monitor.  [employer] supplies one patch monitor per enrollment.  Additional stickers are not available.   Please do not apply patch if you will be having a Nuclear Stress Test, Echocardiogram, Cardiac CT, MRI, or Chest Xray during the time frame you would be wearing the monitor. The patch cannot be worn during these tests.  You cannot remove and re-apply the ZIO XT patch monitor.   Your ZIO patch monitor will be sent USPS Priority mail from [employer] [facility] directly to your home address. The monitor may also be mailed to a PO BOX if home delivery is not available.   It may take 3-5  days to receive your monitor after you have been enrolled.   Once you have received you monitor, please review enclosed instructions.  Your monitor has already been registered assigning a specific monitor serial # to you.   Applying the monitor   Shave hair from upper left chest.   Hold abrader disc by orange tab.  Rub abrader in 40 strokes over left upper chest as indicated in your monitor instructions.   Clean area with 4 enclosed alcohol pads .  Use all pads to assure are is cleaned thoroughly.  Let dry.   Apply patch as indicated in monitor instructions.  Patch will be place under collarbone on left side of chest with arrow pointing upward.   Rub patch adhesive wings for 2 minutes.Remove white label marked "1".  Remove white label marked "2".  Rub patch adhesive wings for 2 additional minutes.   While looking in a mirror, press and release button in center of patch.  A small green light will flash 3-4 times .  This will be your only indicator the monitor has been turned on.     Do not shower for the first 24 hours.  You may shower after the first 24 hours.   Press button if you feel a symptom. You will hear a small click.  Record Date, Time and Symptom in the Patient Log Book.   When you are ready to remove patch, follow instructions on last 2 pages of Patient Log Book.  Stick patch monitor onto last page of Patient Log Book.   Place  Patient Log Book in [facility] box.  Use locking tab on box and tape box closed securely.  The [employer] and [employer] has [employer] on it.  Please place in mailbox as soon as possible.  Your physician should have your test results approximately 7 days after the monitor has been mailed back to [facility].   Call [facility] Customer Care at [phone] if you have questions regarding your ZIO XT patch monitor.  Call them immediately if you see an orange light blinking on your monitor.   If your monitor falls off in less than 4 days contact our  Monitor department at [phone].  If your monitor becomes loose or falls off after 4 days call [employer] at [phone] for suggestions on securing your monitor.   Follow-Up: At [facility], you and your health needs are our priority.  As part of our continuing mission to provide you with exceptional heart care, we have created designated Provider Care Teams.  These Care Teams include your primary Cardiologist (physician) and Advanced Practice Providers (APPs -  Physician Assistants and Nurse Practitioners) who all work together to provide you with the care you need, when you need it.  Your next appointment:   1 MONTH ( RIGHT AFTER TESTING)  The format for your next appointment:   In Person  Provider:   Nanetta Batty, MD

## 2020-09-13 NOTE — Progress Notes (Signed)
Enrolled patient for a 14 day Zio XT monitor to be mailed to patiens home 

## 2020-09-16 DIAGNOSIS — R002 Palpitations: Secondary | ICD-10-CM

## 2020-09-16 DIAGNOSIS — G4733 Obstructive sleep apnea (adult) (pediatric): Secondary | ICD-10-CM

## 2020-09-18 ENCOUNTER — Ambulatory Visit (INDEPENDENT_AMBULATORY_CARE_PROVIDER_SITE_OTHER): Payer: 59 | Admitting: Psychology

## 2020-09-18 DIAGNOSIS — F411 Generalized anxiety disorder: Secondary | ICD-10-CM | POA: Diagnosis not present

## 2020-10-01 DIAGNOSIS — G4733 Obstructive sleep apnea (adult) (pediatric): Secondary | ICD-10-CM | POA: Diagnosis not present

## 2020-10-01 DIAGNOSIS — R002 Palpitations: Secondary | ICD-10-CM | POA: Diagnosis not present

## 2020-10-02 ENCOUNTER — Ambulatory Visit (INDEPENDENT_AMBULATORY_CARE_PROVIDER_SITE_OTHER): Payer: 59 | Admitting: Psychology

## 2020-10-02 DIAGNOSIS — F411 Generalized anxiety disorder: Secondary | ICD-10-CM | POA: Diagnosis not present

## 2020-10-04 DIAGNOSIS — R002 Palpitations: Secondary | ICD-10-CM | POA: Diagnosis not present

## 2020-10-04 DIAGNOSIS — G4733 Obstructive sleep apnea (adult) (pediatric): Secondary | ICD-10-CM | POA: Diagnosis not present

## 2020-10-04 LAB — HEPATIC FUNCTION PANEL
ALT: 35 IU/L (ref 0–44)
AST: 23 IU/L (ref 0–40)
Albumin: 5 g/dL (ref 4.0–5.0)
Alkaline Phosphatase: 76 IU/L (ref 44–121)
Bilirubin Total: 0.3 mg/dL (ref 0.0–1.2)
Bilirubin, Direct: 0.12 mg/dL (ref 0.00–0.40)
Total Protein: 7.3 g/dL (ref 6.0–8.5)

## 2020-10-04 LAB — LIPID PANEL
Chol/HDL Ratio: 4.6 ratio (ref 0.0–5.0)
Cholesterol, Total: 224 mg/dL — ABNORMAL HIGH (ref 100–199)
HDL: 49 mg/dL (ref >39)
LDL Chol Calc (NIH): 156 mg/dL — ABNORMAL HIGH (ref 0–99)
Triglycerides: 107 mg/dL (ref 0–149)
VLDL Cholesterol Cal: 19 mg/dL (ref 5–40)

## 2020-10-09 NOTE — Telephone Encounter (Signed)
Message sent to Dr.Berry for advice. 

## 2020-10-10 ENCOUNTER — Other Ambulatory Visit: Payer: Self-pay

## 2020-10-10 ENCOUNTER — Ambulatory Visit (HOSPITAL_COMMUNITY): Payer: 59 | Attending: Cardiology

## 2020-10-10 DIAGNOSIS — R002 Palpitations: Secondary | ICD-10-CM | POA: Diagnosis present

## 2020-10-10 DIAGNOSIS — G4733 Obstructive sleep apnea (adult) (pediatric): Secondary | ICD-10-CM | POA: Diagnosis not present

## 2020-10-10 LAB — ECHOCARDIOGRAM COMPLETE
Area-P 1/2: 4.06 cm2
S' Lateral: 3.4 cm

## 2020-10-16 ENCOUNTER — Ambulatory Visit (INDEPENDENT_AMBULATORY_CARE_PROVIDER_SITE_OTHER): Payer: 59 | Admitting: Psychology

## 2020-10-16 DIAGNOSIS — F411 Generalized anxiety disorder: Secondary | ICD-10-CM | POA: Diagnosis not present

## 2020-10-27 ENCOUNTER — Other Ambulatory Visit: Payer: Self-pay | Admitting: Family Medicine

## 2020-10-27 DIAGNOSIS — F419 Anxiety disorder, unspecified: Secondary | ICD-10-CM

## 2020-10-28 MED ORDER — ALPRAZOLAM 0.25 MG PO TABS: 0.2500 mg | ORAL_TABLET | Freq: Every evening | ORAL | 0 refills | Status: DC | PRN

## 2020-10-28 NOTE — Telephone Encounter (Signed)
Is this okay to refill? 

## 2020-10-30 ENCOUNTER — Ambulatory Visit (INDEPENDENT_AMBULATORY_CARE_PROVIDER_SITE_OTHER): Payer: 59 | Admitting: Psychology

## 2020-10-30 DIAGNOSIS — F411 Generalized anxiety disorder: Secondary | ICD-10-CM

## 2020-11-01 ENCOUNTER — Encounter: Payer: Self-pay | Admitting: Cardiovascular Disease

## 2020-11-01 ENCOUNTER — Ambulatory Visit: Payer: 59 | Admitting: Cardiovascular Disease

## 2020-11-01 ENCOUNTER — Other Ambulatory Visit: Payer: Self-pay

## 2020-11-01 DIAGNOSIS — E785 Hyperlipidemia, unspecified: Secondary | ICD-10-CM | POA: Insufficient documentation

## 2020-11-01 DIAGNOSIS — E782 Mixed hyperlipidemia: Secondary | ICD-10-CM | POA: Diagnosis not present

## 2020-11-01 DIAGNOSIS — G4733 Obstructive sleep apnea (adult) (pediatric): Secondary | ICD-10-CM | POA: Diagnosis not present

## 2020-11-01 DIAGNOSIS — R002 Palpitations: Secondary | ICD-10-CM

## 2020-11-01 NOTE — Patient Instructions (Signed)

## 2020-11-01 NOTE — Progress Notes (Signed)
11/01/2020 Pauline Good   Dec 08, 1975  [id number]  Primary Physician Ronnald Nian, MD Primary Cardiologist: Runell Gess MD Nicholes Calamity, [place]  HPI:  Arthur Ho is a 45 y.o.  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  He was referred by Crosby Oyster, PA-C for evaluation of palpitations.  He he is married to a CNA who works at the [employer].  I last saw him in the office 09/13/2020.  He has no cardiac risk factors.  His last lipid profile however 10/14/2017 revealed total cholesterol 247 with an LDL of 147.  Both his father and brother have had A. fib and ablation.  He does have a generalized anxiety disorder followed by Dr. Caralyn Guile on as needed Xanax.  He said the Xanax does improve his palpitations.  Since I saw him we did get a 2D echo that was entirely normal and an event monitor that showed PACs, PVCs and 1 short run of nonsustained ventricular tachycardia at 3 AM which she was unaware of.  He specifically denies chest pain or shortness of breath.  He thinks that some of his palpitations is related to anxiety as well.     Current Meds  Medication Sig   ALPRAZolam (XANAX) 0.25 MG tablet Take 1 tablet (0.25 mg total) by mouth at bedtime as needed for anxiety.     Allergies  Allergen Reactions   Contrast Media [Iodinated Diagnostic Agents] Palpitations    SOB   Penicillins Anaphylaxis   Sulfa Antibiotics     Social History   Socioeconomic History   Marital status: Married    Spouse name: Not on file   Number of children: 1   Years of education: Not on file   Highest education level: Not on file  Occupational History   Occupation: Advertising account planner    Employer: [employer]  Tobacco Use   Smoking status: Former    Pack years: 0.00    Types: Cigarettes    Quit date: 05/04/2008    Years since quitting: 12.5   Smokeless tobacco: Never  Vaping Use   Vaping Use: Never used   Substance and Sexual Activity   Alcohol use: No   Drug use: No   Sexual activity: Yes  Other Topics Concern   Not on file  Social History Narrative   Not on file   Social Determinants of Health   Financial Resource Strain: Not on file  Food Insecurity: Not on file  Transportation Needs: Not on file  Physical Activity: Not on file  Stress: Not on file  Social Connections: Not on file  Intimate Partner Violence: Not on file     Review of Systems: General: negative for chills, fever, night sweats or weight changes.  Cardiovascular: negative for chest pain, dyspnea on exertion, edema, orthopnea, palpitations, paroxysmal nocturnal dyspnea or shortness of breath Dermatological: negative for rash Respiratory: negative for cough or wheezing Urologic: negative for hematuria Abdominal: negative for nausea, vomiting, diarrhea, bright red blood per rectum, melena, or hematemesis Neurologic: negative for visual changes, syncope, or dizziness All other systems reviewed and are otherwise negative except as noted above.    Blood pressure 122/71, pulse 74, height 5\' 9"  (1.753 m), weight 204 lb (92.5 kg), SpO2 96 %.  General appearance: alert and no distress Neck: no adenopathy, no carotid bruit, no JVD, supple, symmetrical, trachea midline, and thyroid not enlarged, symmetric, no tenderness/mass/nodules Lungs:  clear to auscultation bilaterally Heart: regular rate and rhythm, S1, S2 normal, no murmur, click, rub or gallop Extremities: extremities normal, atraumatic, no cyanosis or edema Pulses: 2+ and symmetric Skin: Skin color, texture, turgor normal. No rashes or lesions Neurologic: Grossly normal  EKG not performed today  ASSESSMENT AND PLAN:   Palpitations History of palpitations with event monitor showing PACs, PVCs and 1 episode of nonsustained VT at 3 AM which she was unaware of.  He thinks that some of these are anxiety related although he does have symptoms of obstructive  sleep apnea.  We talked about empiric low-dose beta-blockade which he was hoping to avoid.  Obstructive sleep apnea History of symptoms compatible with obstructive sleep apnea with nocturnal snoring and daytime somnolence.  I am concerned that this may be contributing to his palpitations, PACs and PVCs.  We will obtain an outpatient sleep study to further evaluate.  Hyperlipidemia History of hyperlipidemia currently not on statin therapy with lipid profile performed 10/04/2020 revealing total cholesterol 224, LDL of 156 and HDL 49.  I did order a coronary calcium score to help decide how aggressive to be with risk factor modification.     Runell Gess MD FACP,FACC,FAHA, [facility] 11/01/2020 11:14 AM

## 2020-11-01 NOTE — Assessment & Plan Note (Signed)
History of symptoms compatible with obstructive sleep apnea with nocturnal snoring and daytime somnolence.  I am concerned that this may be contributing to his palpitations, PACs and PVCs.  We will obtain an outpatient sleep study to further evaluate.

## 2020-11-01 NOTE — Assessment & Plan Note (Signed)
History of hyperlipidemia currently not on statin therapy with lipid profile performed 10/04/2020 revealing total cholesterol 224, LDL of 156 and HDL 49.  I did order a coronary calcium score to help decide how aggressive to be with risk factor modification.

## 2020-11-01 NOTE — Assessment & Plan Note (Signed)
History of palpitations with event monitor showing PACs, PVCs and 1 episode of nonsustained VT at 3 AM which she was unaware of.  He thinks that some of these are anxiety related although he does have symptoms of obstructive sleep apnea.  We talked about empiric low-dose beta-blockade which he was hoping to avoid.

## 2020-11-11 ENCOUNTER — Inpatient Hospital Stay: Admission: RE | Admit: 2020-11-11 | Payer: 59 | Source: Ambulatory Visit

## 2020-11-27 ENCOUNTER — Ambulatory Visit (INDEPENDENT_AMBULATORY_CARE_PROVIDER_SITE_OTHER): Payer: 59 | Admitting: Psychology

## 2020-11-27 DIAGNOSIS — F411 Generalized anxiety disorder: Secondary | ICD-10-CM

## 2020-12-03 ENCOUNTER — Other Ambulatory Visit: Payer: Self-pay

## 2020-12-11 ENCOUNTER — Other Ambulatory Visit: Payer: Self-pay

## 2020-12-11 ENCOUNTER — Ambulatory Visit (HOSPITAL_BASED_OUTPATIENT_CLINIC_OR_DEPARTMENT_OTHER): Payer: 59 | Admitting: Cardiovascular Disease

## 2020-12-11 DIAGNOSIS — R002 Palpitations: Secondary | ICD-10-CM

## 2020-12-11 DIAGNOSIS — G4733 Obstructive sleep apnea (adult) (pediatric): Secondary | ICD-10-CM

## 2020-12-18 ENCOUNTER — Ambulatory Visit (INDEPENDENT_AMBULATORY_CARE_PROVIDER_SITE_OTHER): Payer: 59 | Admitting: Psychology

## 2020-12-18 DIAGNOSIS — F411 Generalized anxiety disorder: Secondary | ICD-10-CM

## 2020-12-19 ENCOUNTER — Other Ambulatory Visit: Payer: Self-pay

## 2020-12-19 ENCOUNTER — Ambulatory Visit (HOSPITAL_BASED_OUTPATIENT_CLINIC_OR_DEPARTMENT_OTHER): Payer: 59 | Attending: Cardiovascular Disease | Admitting: Cardiovascular Disease

## 2020-12-19 DIAGNOSIS — G4736 Sleep related hypoventilation in conditions classified elsewhere: Secondary | ICD-10-CM | POA: Diagnosis not present

## 2020-12-19 DIAGNOSIS — R0902 Hypoxemia: Secondary | ICD-10-CM | POA: Insufficient documentation

## 2020-12-19 DIAGNOSIS — G4733 Obstructive sleep apnea (adult) (pediatric): Secondary | ICD-10-CM

## 2020-12-19 DIAGNOSIS — R002 Palpitations: Secondary | ICD-10-CM

## 2020-12-24 ENCOUNTER — Other Ambulatory Visit: Payer: Self-pay | Admitting: Family Medicine

## 2020-12-24 DIAGNOSIS — F419 Anxiety disorder, unspecified: Secondary | ICD-10-CM

## 2020-12-24 MED ORDER — ALPRAZOLAM 0.25 MG PO TABS: 0.2500 mg | ORAL_TABLET | Freq: Every evening | ORAL | 0 refills | Status: DC | PRN

## 2020-12-24 NOTE — Telephone Encounter (Signed)
[facility] is requesting to fill pt xanax. Please advise KH 

## 2021-01-08 ENCOUNTER — Ambulatory Visit (INDEPENDENT_AMBULATORY_CARE_PROVIDER_SITE_OTHER): Payer: 59 | Admitting: Psychology

## 2021-01-08 ENCOUNTER — Encounter (HOSPITAL_BASED_OUTPATIENT_CLINIC_OR_DEPARTMENT_OTHER): Payer: Self-pay | Admitting: Cardiovascular Disease

## 2021-01-08 DIAGNOSIS — F411 Generalized anxiety disorder: Secondary | ICD-10-CM | POA: Diagnosis not present

## 2021-01-08 NOTE — Procedures (Signed)
     Patient Name: Arthur Ho, Arthur Ho Date: 12/20/2020 Gender: Male D.O.B: [birth year] Age (years): 45 Referring Provider: Runell Gess Height (inches): 68 Interpreting Physician: Nicki Guadalajara MD, ABSM Weight (lbs): 195 RPSGT: Elaina Pattee BMI: 30 MRN: [id number] Neck Size: 16.00  CLINICAL INFORMATION Sleep Study Type: HST  Indication for sleep study: Snoring, nocturnal palpitations  Epworth Sleepiness Score: 6  SLEEP STUDY TECHNIQUE A multi-channel overnight portable sleep study was performed. The channels recorded were: nasal airflow, thoracic respiratory movement, and oxygen saturation with a pulse oximetry. Snoring was also monitored.  MEDICATIONS ALPRAZolam (XANAX) 0.25 MG tablet Patient self administered medications include: Snoring, nocturnal palpitations  SLEEP ARCHITECTURE Patient was studied for 349 minutes. The sleep efficiency was 99.9 % and the patient was supine for 89.1%. The arousal index was 0.0 per hour.  RESPIRATORY PARAMETERS The overall AHI was 25.1 per hour, with a central apnea index of 0 per hour.  The oxygen nadir was 82% during sleep.  CARDIAC DATA Mean heart rate during sleep was 62.6 bpm.  IMPRESSIONS - Moderate obstructive sleep apnea occurred during this study (AHI 25.1/h). There is a positional component with supine sleep AHI 27.2/h versus non-supine sleep AHI 7.9/h. - Moderate oxygen desaturation to a nadir of 82%. - Patient snored 40.8% during the sleep.  DIAGNOSIS - Obstructive Sleep Apnea (G47.33) - Nocturnal Hypoxemia (G47.36)  RECOMMENDATIONS - In this patient with cardiovascular comorbidities, recommend a CPAP titration study. If unable to have an in-lab titration, initiate Auto-PAP with EPR of 3 at 6 - 18 cm of water. - Effort should be made to optimize nasal and oropharyngeal patency. - Positional therapy avoiding supine position during sleep. - Avoid alcohol, sedatives and other CNS depressants that may worsen  sleep apnea and disrupt normal sleep architecture. - Sleep hygiene should be reviewed to assess factors that may improve sleep quality. - Weight management and regular exercise should be initiated or continued. - Recommend a download and sleep clinic evauation after one month of therapy.   [Electronically signed] 01/08/2021 08:43 PM  Nicki Guadalajara MD, [facility], ABSM Diplomate, American Board of Sleep Medicine   NPI: [id number]  [facility] PH: [phone]   FX: [phone] ACCREDITED BY THE AMERICAN ACADEMY OF SLEEP MEDICINE

## 2021-01-20 ENCOUNTER — Telehealth: Payer: Self-pay | Admitting: *Deleted

## 2021-01-20 ENCOUNTER — Other Ambulatory Visit: Payer: Self-pay | Admitting: Cardiovascular Disease

## 2021-01-20 DIAGNOSIS — G4733 Obstructive sleep apnea (adult) (pediatric): Secondary | ICD-10-CM

## 2021-01-20 NOTE — Telephone Encounter (Signed)
-----   Message from Lennette Bihari, MD sent at 01/08/2021  8:47 PM EDT ----- Arthur Ho, please notify pt and try for CPAP titration; if not, able, then Auto-PAP

## 2021-01-20 NOTE — Telephone Encounter (Signed)
Patient notified of HST results and recommendations. Patient agrees to proceed with CPAP titration scheduled for November 8th.

## 2021-01-28 ENCOUNTER — Ambulatory Visit (INDEPENDENT_AMBULATORY_CARE_PROVIDER_SITE_OTHER): Payer: 59 | Admitting: Psychology

## 2021-01-28 DIAGNOSIS — F411 Generalized anxiety disorder: Secondary | ICD-10-CM

## 2021-02-20 ENCOUNTER — Ambulatory Visit (INDEPENDENT_AMBULATORY_CARE_PROVIDER_SITE_OTHER): Payer: 59 | Admitting: Psychology

## 2021-02-20 DIAGNOSIS — F411 Generalized anxiety disorder: Secondary | ICD-10-CM

## 2021-03-11 ENCOUNTER — Encounter (HOSPITAL_BASED_OUTPATIENT_CLINIC_OR_DEPARTMENT_OTHER): Payer: 59 | Admitting: Cardiovascular Disease

## 2021-03-13 ENCOUNTER — Ambulatory Visit (INDEPENDENT_AMBULATORY_CARE_PROVIDER_SITE_OTHER): Payer: 59 | Admitting: Psychology

## 2021-03-13 DIAGNOSIS — F411 Generalized anxiety disorder: Secondary | ICD-10-CM | POA: Diagnosis not present

## 2021-03-20 ENCOUNTER — Ambulatory Visit (HOSPITAL_BASED_OUTPATIENT_CLINIC_OR_DEPARTMENT_OTHER): Payer: 59 | Attending: Cardiovascular Disease | Admitting: Cardiovascular Disease

## 2021-03-20 ENCOUNTER — Other Ambulatory Visit: Payer: Self-pay

## 2021-03-20 DIAGNOSIS — G4733 Obstructive sleep apnea (adult) (pediatric): Secondary | ICD-10-CM | POA: Diagnosis present

## 2021-03-24 ENCOUNTER — Other Ambulatory Visit: Payer: Self-pay | Admitting: Family Medicine

## 2021-03-24 DIAGNOSIS — F419 Anxiety disorder, unspecified: Secondary | ICD-10-CM

## 2021-03-25 MED ORDER — ALPRAZOLAM 0.25 MG PO TABS: 0.2500 mg | ORAL_TABLET | Freq: Every evening | ORAL | 0 refills | Status: DC | PRN

## 2021-03-25 NOTE — Telephone Encounter (Signed)
[facility] to [facility] pt [facility]. Please advise kh

## 2021-04-04 ENCOUNTER — Ambulatory Visit (INDEPENDENT_AMBULATORY_CARE_PROVIDER_SITE_OTHER): Payer: 59 | Admitting: Psychology

## 2021-04-04 DIAGNOSIS — F411 Generalized anxiety disorder: Secondary | ICD-10-CM | POA: Diagnosis not present

## 2021-04-07 ENCOUNTER — Encounter: Payer: Self-pay | Admitting: Cardiovascular Disease

## 2021-04-14 ENCOUNTER — Encounter (HOSPITAL_BASED_OUTPATIENT_CLINIC_OR_DEPARTMENT_OTHER): Payer: Self-pay | Admitting: Cardiovascular Disease

## 2021-04-14 NOTE — Procedures (Signed)
     Patient Name: Arthur Ho, Arthur Ho Date: 03/20/2021 Gender: Male D.O.B: 1976/01/14 Age (years): 45 Referring Provider: Runell Gess Height (inches): 68 Interpreting Physician: Nicki Guadalajara MD, ABSM Weight (lbs): 204 RPSGT: Armen Pickup BMI: 31 MRN: [id number] Neck Size: 16.00  CLINICAL INFORMATION The patient is referred for a CPAP titration to treat sleep apnea  Date of HST: 12/19/2020: AHI 25.1/h; supine AHI 27.2/h; O2 nadir 82%.  SLEEP STUDY TECHNIQUE As per the AASM Manual for the Scoring of Sleep and Associated Events v2.3 (April 2016) with a hypopnea requiring 4% desaturations.  The channels recorded and monitored were frontal, central and occipital EEG, electrooculogram (EOG), submentalis EMG (chin), nasal and oral airflow, thoracic and abdominal wall motion, anterior tibialis EMG, snore microphone, electrocardiogram, and pulse oximetry. Continuous positive airway pressure (CPAP) was initiated at the beginning of the study and titrated to treat sleep-disordered breathing.  MEDICATIONS ALPRAZolam (XANAX) 0.25 MG tablet Medications self-administered by patient taken the night of the study : N/A  TECHNICIAN COMMENTS Comments added by technician: one restroom visted Comments added by scorer: N/A  RESPIRATORY PARAMETERS Optimal PAP Pressure (cm): 9 AHI at Optimal Pressure (/hr): 0 Overall Minimal O2 (%): 80.0 Supine % at Optimal Pressure (%): 46 Minimal O2 at Optimal Pressure (%): 91.0   SLEEP ARCHITECTURE The study was initiated at 10:22:00 PM and ended at 5:08:43 AM.  Sleep onset time was 21.5 minutes and the sleep efficiency was 78.7%%. The total sleep time was 320 minutes.  The patient spent 4.1%% of the night in stage N1 sleep, 56.9%% in stage N2 sleep, 0.2%% in stage N3 and 38.9% in REM.Stage REM latency was 62.5 minutes  Wake after sleep onset was 65.2. Alpha intrusion was absent. Supine sleep was 54.22%.  CARDIAC DATA The 2 lead EKG demonstrated  sinus rhythm. The mean heart rate was 56.7 beats per minute. Other EKG findings include: None.  LEG MOVEMENT DATA The total Periodic Limb Movements of Sleep (PLMS) were 0. The PLMS index was 0.0. A PLMS index of <15 is considered normal in adults.  IMPRESSIONS - CPAP was initiated at 6 cm and was titrated to optimal PAP pressure at  9 cm of water. (AHI 0; O2 nadir 91%) - Significant oxygen desaturation to a nadir of 80% at  7 cm of water. - No snoring was audible during this study. - No cardiac abnormalities were observed during this study. - Clinically significant periodic limb movements were not noted during this study. Arousals associated with PLMs were rare.  DIAGNOSIS - Obstructive Sleep Apnea (G47.33)  RECOMMENDATIONS - Recommend a trial of CPAP Auto therapy with EPR of 3 at 9 - 12 cm H2O with heated humidification.  A Small-Medium size Fisher&Paykel Full Face Mask Evora Full Mask mask was used for the titration.  - Effort should be made to optimize nasal and oropharyngeal patency. - Avoid alcohol, sedatives and other CNS depressants that may worsen sleep apnea and disrupt normal sleep architecture. - Sleep hygiene should be reviewed to assess factors that may improve sleep quality. - Weight management (BMI 31) and regular exercise should be initiated or continued. - Recommend a download in 30 days and sleep clinic evaluation after 4 weeks of therapy.   [Electronically signed] 04/14/2021 06:48 AM  Nicki Guadalajara MD, [facility], ABSM Diplomate, American Board of Sleep Medicine   NPI: [id number]  [facility] PH: [phone]   FX: [phone] ACCREDITED BY THE AMERICAN ACADEMY OF SLEEP MEDICINE

## 2021-04-16 ENCOUNTER — Telehealth: Payer: Self-pay | Admitting: *Deleted

## 2021-04-16 NOTE — Telephone Encounter (Signed)
Patient informed CPAP titration study has been completed. Order for CPAP machine has been submitted to [facility] via [employer] portal. Patient also informed of the machine back order. He voiced understanding.

## 2021-04-16 NOTE — Telephone Encounter (Signed)
-----   Message from Lennette Bihari, MD sent at 04/14/2021  6:52 AM EST ----- Burna Mortimer, please notify pt and set up with DME for CPAP initiation

## 2021-04-30 DIAGNOSIS — G4733 Obstructive sleep apnea (adult) (pediatric): Secondary | ICD-10-CM | POA: Diagnosis not present

## 2021-04-30 DIAGNOSIS — R0683 Snoring: Secondary | ICD-10-CM | POA: Diagnosis not present

## 2021-05-08 ENCOUNTER — Ambulatory Visit: Payer: 59 | Admitting: Psychology

## 2021-05-29 ENCOUNTER — Ambulatory Visit: Payer: 59 | Admitting: Psychology

## 2021-05-30 ENCOUNTER — Ambulatory Visit (INDEPENDENT_AMBULATORY_CARE_PROVIDER_SITE_OTHER): Payer: 59 | Admitting: Psychology

## 2021-05-30 DIAGNOSIS — F411 Generalized anxiety disorder: Secondary | ICD-10-CM | POA: Diagnosis not present

## 2021-05-30 NOTE — Progress Notes (Signed)
05/30/2021  Treatment Plan: Diagnosis F41.1 (Generalized anxiety disorder) [n/a]  Symptoms Excessive and/or unrealistic worry that is difficult to control occurring more days than not for at least 6 months about a number of events or activities. (Status: maintained) -- No Description Entered  Hypervigilance (e.g., feeling constantly on edge, experiencing concentration difficulties, having trouble falling or staying asleep, exhibiting a general state of irritability). (Status: maintained) -- No Description Entered  Medication Status compliance  Safety none  If Suicidal or Homicidal State Action Taken: unspecified  Current Risk: low Medications xanax (Dosage: unknown)  Objectives Related Problem: Learn and implement coping skills that result in a reduction of anxiety and worry, and improved daily functioning. Description: Reestablish a consistent sleep-wake cycle. Target Date: 2021-06-26 Frequency: Daily Modality: individual Progress: 20%  Related Problem: Learn and implement coping skills that result in a reduction of anxiety and worry, and improved daily functioning. Description: Maintain involvement in work, family, and social activities. Target Date: 2021-06-26 Frequency: Daily Modality: individual Progress: 50%  Related Problem: Learn and implement coping skills that result in a reduction of anxiety and worry, and improved daily functioning. Description: Learn and implement problem-solving strategies for realistically addressing worries. Target Date: 2021-06-26 Frequency: Daily Modality: individual Progress: 60%  Related Problem: Learn and implement coping skills that result in a reduction of anxiety and worry, and improved daily functioning. Description: Complete a medical evaluation to assess for possible contribution of medical or substance-related conditions to the anxiety. Target Date: 2021-06-26 Frequency: Daily Modality: individual Progress: 90%  Related Problem:  Learn and implement coping skills that result in a reduction of anxiety and worry, and improved daily functioning. Description: Describe situations, thoughts, feelings, and actions associated with anxieties and worries, their impact on functioning, and attempts to resolve them. Target Date: 2021-06-26 Frequency: Daily Modality: individual Progress: 70%  Client Response full compliance  Service Location Location, [address] [place], [place] [address]  Service Code cpt [phone] P Identify automatic thoughts  Facilitate problem solving  Session Notes:  Dx.: Generalized Anxiety  Meds.: Xanax  Patient states that he would like to have a Webex video session due to the pandemic. He is at home and I am at my home office.  Goals: States that he is seeking therapy to reduce extreme anxiety and the associated cognitive and physical distress. Target date is 5-22. Revised target is 2-23.  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 Had some unexpected expenses. Business is good, but he wants it to be a little better. He feels the transition has gone very well and she is helping him "quite a lot". His anxiety has picked up a little related to the business, but it is far more manageable than before. He says that things at home are better, with the exception to his wife's job. He says she in in therapy and he notices she is doing better at setting boundaries with the "toxic people in her life".           Marcelina Morel, PhD Time: 11:35-12:25p 50 min.

## 2021-06-13 ENCOUNTER — Ambulatory Visit (INDEPENDENT_AMBULATORY_CARE_PROVIDER_SITE_OTHER): Payer: 59 | Admitting: Psychology

## 2021-06-13 DIAGNOSIS — F411 Generalized anxiety disorder: Secondary | ICD-10-CM | POA: Diagnosis not present

## 2021-06-13 NOTE — Progress Notes (Signed)
06/13/2021  Treatment Plan: Diagnosis F41.1 (Generalized anxiety disorder) [n/a]  Symptoms Excessive and/or unrealistic worry that is difficult to control occurring more days than not for at least 6 months about a number of events or activities. (Status: maintained) -- No Description Entered  Hypervigilance (e.g., feeling constantly on edge, experiencing concentration difficulties, having trouble falling or staying asleep, exhibiting a general state of irritability). (Status: maintained) -- No Description Entered  Medication Status compliance  Safety none  If Suicidal or Homicidal State Action Taken: unspecified  Current Risk: low Medications xanax (Dosage: unknown)  Objectives Related Problem: Learn and implement coping skills that result in a reduction of anxiety and worry, and improved daily functioning. Description: Reestablish a consistent sleep-wake cycle. Target Date: 2022-04-25 Frequency: Daily Modality: individual Progress: 20%  Related Problem: Learn and implement coping skills that result in a reduction of anxiety and worry, and improved daily functioning. Description: Maintain involvement in work, family, and social activities. Target Date: 2022-04-25 Frequency: Daily Modality: individual Progress: 50%  Related Problem: Learn and implement coping skills that result in a reduction of anxiety and worry, and improved daily functioning. Description: Learn and implement problem-solving strategies for realistically addressing worries. Target Date: 2022-04-25 Frequency: Daily Modality: individual Progress: 60%  Related Problem: Learn and implement coping skills that result in a reduction of anxiety and worry, and improved daily functioning. Description: Complete a medical evaluation to assess for possible contribution of medical or substance-related conditions to the anxiety. Target Date: 2022-04-25 Frequency: Daily Modality: individual Progress: 90%  Related Problem:  Learn and implement coping skills that result in a reduction of anxiety and worry, and improved daily functioning. Description: Describe situations, thoughts, feelings, and actions associated with anxieties and worries, their impact on functioning, and attempts to resolve them. Target Date: 2022-04-25 Frequency: Daily Modality: individual Progress: 70%  Client Response full compliance  Service Location Location, [address] [place], [place] [address]  Service Code cpt [phone] P Identify automatic thoughts  Facilitate problem solving  Session Notes:  Dx.: Generalized Anxiety  Meds.: Xanax  Patient states that he would like to have a Webex video session due to the pandemic. He is at home and I am at my home office.  Goals: States that he is seeking therapy to reduce extreme anxiety and the associated cognitive and physical distress. Target date is 5-22. Revised target is 12-23. Session notes: He talked about using cannabis gummies to help his anxiety. When more extreme, he will take a Xanax. Yesterday was one of those difficult days and he was very tense about work. He took a Xanax and it helped. He states that his wife found out she can transfer to an office near their home, but will have to take a pay cut. She did not accept until they rectify. He had slacked on his self care but is now in gym every other day.                 Garrel Ridgel, PhD Time: 11:40-12:30p 50 min.

## 2021-06-25 ENCOUNTER — Ambulatory Visit (INDEPENDENT_AMBULATORY_CARE_PROVIDER_SITE_OTHER): Payer: 59 | Admitting: Psychology

## 2021-06-25 DIAGNOSIS — F411 Generalized anxiety disorder: Secondary | ICD-10-CM | POA: Diagnosis not present

## 2021-06-25 NOTE — Progress Notes (Signed)
° ° ° ° ° ° ° ° ° ° ° ° ° ° °  06/25/2021  Treatment Plan: Diagnosis F41.1 (Generalized anxiety disorder) [n/a]  Symptoms Excessive and/or unrealistic worry that is difficult to control occurring more days than not for at least 6 months about a number of events or activities. (Status: maintained) -- No Description Entered  Hypervigilance (e.g., feeling constantly on edge, experiencing concentration difficulties, having trouble falling or staying asleep, exhibiting a general state of irritability). (Status: maintained) -- No Description Entered  Medication Status compliance  Safety none  If Suicidal or Homicidal State Action Taken: unspecified  Current Risk: low Medications xanax (Dosage: unknown)  Objectives Related Problem: Learn and implement coping skills that result in a reduction of anxiety and worry, and improved daily functioning. Description: Reestablish a consistent sleep-wake cycle. Target Date: 2022-04-25 Frequency: Daily Modality: individual Progress: 20%  Related Problem: Learn and implement coping skills that result in a reduction of anxiety and worry, and improved daily functioning. Description: Maintain involvement in work, family, and social activities. Target Date: 2022-04-25 Frequency: Daily Modality: individual Progress: 50%  Related Problem: Learn and implement coping skills that result in a reduction of anxiety and worry, and improved daily functioning. Description: Learn and implement problem-solving strategies for realistically addressing worries. Target Date: 2022-04-25 Frequency: Daily Modality: individual Progress: 60%  Related Problem: Learn and implement coping skills that result in a reduction of anxiety and worry, and improved daily functioning. Description: Complete a medical evaluation to assess for possible contribution of medical or substance-related conditions to the anxiety. Target Date: 2022-04-25 Frequency: Daily Modality:  individual Progress: 90%  Related Problem: Learn and implement coping skills that result in a reduction of anxiety and worry, and improved daily functioning. Description: Describe situations, thoughts, feelings, and actions associated with anxieties and worries, their impact on functioning, and attempts to resolve them. Target Date: 2022-04-25 Frequency: Daily Modality: individual Progress: 70%  Client Response full compliance  Service Location Location, [address] [place],  [address]  Service Code cpt [phone] P Identify automatic thoughts  Facilitate problem solving  Session Notes:  Dx.: Generalized Anxiety  Meds.: Xanax  Patient states that he would like to have a Webex video session due to the pandemic. He is at home and I am at my home office.  Goals: States that he is seeking therapy to reduce extreme anxiety and the associated cognitive and physical distress. Target date is 5-22. Revised target is 12-23. 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 He says the "only problem" he has lately is all of the difficulty his wife is having. He says "it is not her" rather it is all the stuff she is having to manage, especially her [place]. He tries to stay out of her family issues even though she asks his opinion. He is very pleased with his improved stress level. We talked about his use of CBD and gummies, which he takes on a daily basis. He says it helps him to sleep and calm down. He says that "self talk is best to help him with "intrusive thoughts".               Marcelina Morel, PhD Time: 11:40-12:30p 50 min.

## 2021-07-09 ENCOUNTER — Ambulatory Visit (INDEPENDENT_AMBULATORY_CARE_PROVIDER_SITE_OTHER): Payer: 59 | Admitting: Psychology

## 2021-07-09 DIAGNOSIS — F411 Generalized anxiety disorder: Secondary | ICD-10-CM

## 2021-07-09 NOTE — Progress Notes (Signed)
? ? ? ? ? ?  07/09/2021  ?Treatment Plan: ?Diagnosis ?F41.1 (Generalized anxiety disorder) [n/a]  ?Symptoms ?Excessive and/or unrealistic worry that is difficult to control occurring more days than not for at least 6 months about a number of events or activities. (Status: maintained) -- No Description Entered  ?Hypervigilance (e.g., feeling constantly on edge, experiencing concentration difficulties, having trouble falling or staying asleep, exhibiting a general state of irritability). (Status: maintained) -- No Description Entered  ?Medication Status ?compliance  ?Safety ?none  ?If Suicidal or Homicidal State Action Taken: unspecified  ?Current Risk: low ?Medications ?xanax (Dosage: unknown)  ?Objectives ?Related Problem: Learn and implement coping skills that result in a reduction of anxiety and worry, and improved daily functioning. ?Description: Reestablish a consistent sleep-wake cycle. ?Target Date: 2022-04-25 ?Frequency: Daily ?Modality: individual ?Progress: 20% ? ?Related Problem: Learn and implement coping skills that result in a reduction of anxiety and worry, and improved daily functioning. ?Description: Maintain involvement in work, family, and social activities. ?Target Date: 2022-04-25 ?Frequency: Daily ?Modality: individual ?Progress: 50% ? ?Related Problem: Learn and implement coping skills that result in a reduction of anxiety and worry, and improved daily functioning. ?Description: Learn and implement problem-solving strategies for realistically addressing worries. ?Target Date: 2022-04-25 ?Frequency: Daily ?Modality: individual ?Progress: 60% ? ?Related Problem: Learn and implement coping skills that result in a reduction of anxiety and worry, and improved daily functioning. ?Description: Complete a medical evaluation to assess for possible contribution of medical or substance-related conditions to the anxiety. ?Target Date: 2022-04-25 ?Frequency: Daily ?Modality: individual ?Progress: 90%  ?Related  Problem: Learn and implement coping skills that result in a reduction of anxiety and worry, and improved daily functioning. ?Description: Describe situations, thoughts, feelings, and actions associated with anxieties and worries, their impact on functioning, and attempts to resolve them. ?Target Date: 2022-04-25 ?Frequency: Daily ?Modality: individual ?Progress: 70% ? ?Client Response ?full compliance  ?Service Location ?Location, [address] [place], [place] [address]  ?Service Code ?cpt [id number] P ?Identify automatic thoughts  ?Facilitate problem solving  ?Session Notes:  ?Dx.: Generalized Anxiety  ?Meds.: Xanax  ?Patient states that he would like to have a Webex video session due to the pandemic. He is at home and I am at my home office.  ?Goals: States that he is seeking therapy to reduce extreme anxiety and the associated cognitive and physical distress. Target date is 5-22. Revised target is 12-23. ?Session notes: ?Siah says he is very busy with work and everything is on track as anticipated. His depression has been in check. Cannot remember the last time he took a xanax. He has not yet tried to cut back on his use of THC gummies. He feels he needs it to sleep and is concerned that he will not sleep without it. He thinks it is only thing that "shuts down" his brain. Will consider a gradual adjustment. He is feeling in more control in many areas of his life. ?          ?  ?    ? ? ?Marcelina Morel, PhD Time: 11:40-12:30p 50 min. ? ? ? ? ? ? ? ? ? ? ? ? ? ? ?

## 2021-07-18 ENCOUNTER — Encounter: Payer: Self-pay | Admitting: Cardiovascular Disease

## 2021-07-18 ENCOUNTER — Ambulatory Visit: Payer: 59 | Admitting: Cardiovascular Disease

## 2021-07-18 ENCOUNTER — Other Ambulatory Visit: Payer: Self-pay

## 2021-07-18 DIAGNOSIS — G4733 Obstructive sleep apnea (adult) (pediatric): Secondary | ICD-10-CM

## 2021-07-18 DIAGNOSIS — R002 Palpitations: Secondary | ICD-10-CM

## 2021-07-18 DIAGNOSIS — E782 Mixed hyperlipidemia: Secondary | ICD-10-CM

## 2021-07-18 LAB — LIPID PANEL
Chol/HDL Ratio: 4.4 ratio (ref 0.0–5.0)
Cholesterol, Total: 225 mg/dL — ABNORMAL HIGH (ref 100–199)
HDL: 51 mg/dL (ref >39)
LDL Chol Calc (NIH): 158 mg/dL — ABNORMAL HIGH (ref 0–99)
Triglycerides: 92 mg/dL (ref 0–149)
VLDL Cholesterol Cal: 16 mg/dL (ref 5–40)

## 2021-07-18 LAB — HEPATIC FUNCTION PANEL
ALT: 29 IU/L (ref 0–44)
AST: 21 IU/L (ref 0–40)
Albumin: 4.9 g/dL (ref 4.0–5.0)
Alkaline Phosphatase: 64 IU/L (ref 44–121)
Bilirubin Total: 0.3 mg/dL (ref 0.0–1.2)
Bilirubin, Direct: 0.12 mg/dL (ref 0.00–0.40)
Total Protein: 7.3 g/dL (ref 6.0–8.5)

## 2021-07-18 NOTE — Patient Instructions (Signed)
Medication Instructions:  ?No changes ?*If you need a refill on your cardiac medications before your next appointment, please call your pharmacy* ? ? ?Lab Work: ?Your provider would like for you to have the following labs today: Lipid and Liver ? ?If you have labs (blood work) drawn today and your tests are completely normal, you will receive your results only by: ?MyChart Message (if you have MyChart) OR ?A paper copy in the mail ?If you have any lab test that is abnormal or we need to change your treatment, we will call you to review the results. ? ? ?Testing/Procedures: ?Dr. Gwenlyn Found has ordered a CT coronary calcium score.  ? ?Test locations:  ?[facility] ([address] [place], [place] [address]) ?[facility] ([address] [place], [place] [address])  ? ?This is $99 out of pocket. ? ? ?Coronary CalciumScan ?A coronary calcium scan is an imaging test used to look for deposits of calcium and other fatty materials (plaques) in the inner lining of the blood vessels of the heart (coronary arteries). These deposits of calcium and plaques can partly clog and narrow the coronary arteries without producing any symptoms or warning signs. This puts a person at risk for a heart attack. This test can detect these deposits before symptoms develop. ?Tell a health care provider about: ?Any allergies you have. ?All medicines you are taking, including vitamins, herbs, eye drops, creams, and over-the-counter medicines. ?Any problems you or family members have had with anesthetic medicines. ?Any blood disorders you have. ?Any surgeries you have had. ?Any medical conditions you have. ?Whether you are pregnant or may be pregnant. ?What are the risks? ?Generally, this is a safe procedure. However, problems may occur, including: ?Harm to a pregnant woman and her unborn baby. This test involves the use of radiation. Radiation exposure can be dangerous to a pregnant woman and her unborn baby. If you are pregnant, you  generally should not have this procedure done. ?Slight increase in the risk of cancer. This is because of the radiation involved in the test. ?What happens before the procedure? ?No preparation is needed for this procedure. ?What happens during the procedure? ?You will undress and remove any jewelry around your neck or chest. ?You will put on a hospital gown. ?Sticky electrodes will be placed on your chest. The electrodes will be connected to an electrocardiogram (ECG) machine to record a tracing of the electrical activity of your heart. ?A CT scanner will take pictures of your heart. During this time, you will be asked to lie still and hold your breath for 2-3 seconds while a picture of your heart is being taken. ?The procedure may vary among health care providers and hospitals. ?What happens after the procedure? ?You can get dressed. ?You can return to your normal activities. ?It is up to you to get the results of your test. Ask your health care provider, or the department that is doing the test, when your results will be ready. ?Summary ?A coronary calcium scan is an imaging test used to look for deposits of calcium and other fatty materials (plaques) in the inner lining of the blood vessels of the heart (coronary arteries). ?Generally, this is a safe procedure. Tell your health care provider if you are pregnant or may be pregnant. ?No preparation is needed for this procedure. ?A CT scanner will take pictures of your heart. ?You can return to your normal activities after the scan is done. ?This information is not intended to replace advice given to  you by your health care provider. Make sure you discuss any questions you have with your health care provider. ?Document Released: 10/17/2007 Document Revised: 03/09/2016 Document Reviewed: 03/09/2016 ?Elsevier Interactive Patient Education ? 2017 Elsevier Inc. ? ? ?Follow-Up: ?At [facility], you and your health needs are our priority.  As part of our continuing  mission to provide you with exceptional heart care, we have created designated Provider Care Teams.  These Care Teams include your primary Cardiologist (physician) and Advanced Practice Providers (APPs -  Physician Assistants and Nurse Practitioners) who all work together to provide you with the care you need, when you need it. ? ?We recommend signing up for the patient portal called "MyChart".  Sign up information is provided on this After Visit Summary.  MyChart is used to connect with patients for Virtual Visits (Telemedicine).  Patients are able to view lab/test results, encounter notes, upcoming appointments, etc.  Non-urgent messages can be sent to your provider as well.   ?To learn more about what you can do with MyChart, go to [email or web address].   ? ?Your next appointment:   ?6 month(s) ? ?The format for your next appointment:   ?In Person ? ?Provider:   ?Dr. Gwenlyn Found ? ? ?

## 2021-07-18 NOTE — Progress Notes (Signed)
? ? ? ?07/18/2021 ?Arthur Ho   ?09-17-1975  ?[id number] ? ?Primary Physician Arthur Lung, MD ?Primary Cardiologist: Arthur Harp MD Arthur Ho, [place] ? ?HPI:  Arthur Ho is a 46 y.o.  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  He was referred by Arthur Bode, PA-C for evaluation of palpitations.  He is married to a CNA who works at the [employer].  He owns an Marine scientist.  I last saw him in the office 09/13/2020.  He has no cardiac risk factors.  His last lipid profile however 10/14/2017 revealed total cholesterol 247 with an LDL of 147.  Both his father and brother have had A. fib and ablation.  He does have a generalized anxiety disorder followed by Dr. Apolonio Ho on as needed Xanax.  He said the Xanax does improve his palpitations. ? ?I got  a 2D echo that was entirely normal and an event monitor that showed PACs, PVCs and 1 short run of nonsustained ventricular tachycardia at 3 AM which she was unaware of.  He specifically denies chest pain or shortness of breath.  He thinks that some of his palpitations is related to anxiety as well. ? ?Since I saw him 9 months ago he did change his eating habits and was placed on CPAP which has drastically improved his palpitations. ? ? ? ?Current Meds  ?Medication Sig  ? ALPRAZolam (XANAX) 0.25 MG tablet Take 1 tablet (0.25 mg total) by mouth at bedtime as needed for anxiety.  ?  ? ?Allergies  ?Allergen Reactions  ? Contrast Media [Iodinated Contrast Media] Palpitations  ?  SOB  ? Penicillins Anaphylaxis  ? Sulfa Antibiotics   ? ? ?Social History  ? ?Socioeconomic History  ? Marital status: Married  ?  Spouse name: Not on file  ? Number of children: 1  ? Years of education: Not on file  ? Highest education level: Not on file  ?Occupational History  ? Occupation: Medical illustrator  ?  Employer: [employer]  ?Tobacco Use  ? Smoking status: Former   ?  Types: Cigarettes  ?  Quit date: 05/04/2008  ?  Years since quitting: 13.2  ? Smokeless tobacco: Never  ?Vaping Use  ? Vaping Use: Never used  ?Substance and Sexual Activity  ? Alcohol use: No  ? Drug use: No  ? Sexual activity: Yes  ?Other Topics Concern  ? Not on file  ?Social History Narrative  ? Not on file  ? ?Social Determinants of Health  ? ?Financial Resource Strain: Not on file  ?Food Insecurity: Not on file  ?Transportation Needs: Not on file  ?Physical Activity: Not on file  ?Stress: Not on file  ?Social Connections: Not on file  ?Intimate Partner Violence: Not on file  ?  ? ?Review of Systems: ?General: negative for chills, fever, night sweats or weight changes.  ?Cardiovascular: negative for chest pain, dyspnea on exertion, edema, orthopnea, palpitations, paroxysmal nocturnal dyspnea or shortness of breath ?Dermatological: negative for rash ?Respiratory: negative for cough or wheezing ?Urologic: negative for hematuria ?Abdominal: negative for nausea, vomiting, diarrhea, bright red blood per rectum, melena, or hematemesis ?Neurologic: negative for visual changes, syncope, or dizziness ?All other systems reviewed and are otherwise negative except as noted above. ? ? ? ?Blood pressure 118/80, pulse 60, height 5\' 8"  (1.727 m), weight 207 lb (93.9 kg), SpO2 96 %.  ?General appearance: alert and  no distress ?Neck: no adenopathy, no carotid bruit, no JVD, supple, symmetrical, trachea midline, and thyroid not enlarged, symmetric, no tenderness/mass/nodules ?Lungs: clear to auscultation bilaterally ?Heart: regular rate and rhythm, S1, S2 normal, no murmur, click, rub or gallop ?Extremities: extremities normal, atraumatic, no cyanosis or edema ?Pulses: 2+ and symmetric ?Skin: Skin color, texture, turgor normal. No rashes or lesions ?Neurologic: Grossly normal ? ?EKG sinus rhythm at 60 without ST or T wave changes.  Personally reviewed this EKG. ? ?ASSESSMENT AND PLAN:  ? ?Palpitations ?History of palpitations  with event monitor performed revealing PACs, PVCs and one short run of nonsustained sustained ventricular tachycardia at 3 AM which she was unaware of.  He thought that some of his symptoms were related to stress which improved on Xanax although the most improvement he has had with after being put on CPAP for obstructive sleep apnea. ? ?Obstructive sleep apnea ?History of recent diagnosis of obstructive sleep apnea improved on CPAP.  This is also improved his palpitations. ? ?Hyperlipidemia ?History of hyperlipidemia with lipid profile performed 10/04/2020 revealing total cholesterol 224, LDL 156 and HDL 49.  Liver we will recheck a lipid liver profile this morning since he is dramatically changed his diet.  I am going to get a coronary calcium score to further evaluate how aggressive to be with receptor modification. ? ? ? ? ?Arthur Harp MD FACP,FACC,FAHA, FSCAI ?07/18/2021 ?10:54 AM ?

## 2021-07-18 NOTE — Assessment & Plan Note (Signed)
History of recent diagnosis of obstructive sleep apnea improved on CPAP.  This is also improved his palpitations. ?

## 2021-07-18 NOTE — Assessment & Plan Note (Signed)
History of hyperlipidemia with lipid profile performed 10/04/2020 revealing total cholesterol 224, LDL 156 and HDL 49.  Liver we will recheck a lipid liver profile this morning since he is dramatically changed his diet.  I am going to get a coronary calcium score to further evaluate how aggressive to be with receptor modification. ?

## 2021-07-18 NOTE — Assessment & Plan Note (Signed)
History of palpitations with event monitor performed revealing PACs, PVCs and one short run of nonsustained sustained ventricular tachycardia at 3 AM which she was unaware of.  He thought that some of his symptoms were related to stress which improved on Xanax although the most improvement he has had with after being put on CPAP for obstructive sleep apnea. ?

## 2021-07-30 ENCOUNTER — Ambulatory Visit (INDEPENDENT_AMBULATORY_CARE_PROVIDER_SITE_OTHER): Payer: 59 | Admitting: Psychology

## 2021-07-30 DIAGNOSIS — F411 Generalized anxiety disorder: Secondary | ICD-10-CM | POA: Diagnosis not present

## 2021-07-30 NOTE — Progress Notes (Signed)
07/30/2021  ?Treatment Plan: ?Diagnosis ?F41.1 (Generalized anxiety disorder) [n/a]  ?Symptoms ?Excessive and/or unrealistic worry that is difficult to control occurring more days than not for at least 6 months about a number of events or activities. (Status: maintained) -- No Description Entered  ?Hypervigilance (e.g., feeling constantly on edge, experiencing concentration difficulties, having trouble falling or staying asleep, exhibiting a general state of irritability). (Status: maintained) -- No Description Entered  ?Medication Status ?compliance  ?Safety ?none  ?If Suicidal or Homicidal State Action Taken: unspecified  ?Current Risk: low ?Medications ?xanax (Dosage: unknown)  ?Objectives ?Related Problem: Learn and implement coping skills that result in a reduction of anxiety and worry, and improved daily functioning. ?Description: Reestablish a consistent sleep-wake cycle. ?Target Date: 2022-04-25 ?Frequency: Daily ?Modality: individual ?Progress: 20% ? ?Related Problem: Learn and implement coping skills that result in a reduction of anxiety and worry, and improved daily functioning. ?Description: Maintain involvement in work, family, and social activities. ?Target Date: 2022-04-25 ?Frequency: Daily ?Modality: individual ?Progress: 50% ? ?Related Problem: Learn and implement coping skills that result in a reduction of anxiety and worry, and improved daily functioning. ?Description: Learn and implement problem-solving strategies for realistically addressing worries. ?Target Date: 2022-04-25 ?Frequency: Daily ?Modality: individual ?Progress: 60% ? ?Related Problem: Learn and implement coping skills that result in a reduction of anxiety and worry, and improved daily functioning. ?Description: Complete a medical evaluation to assess for possible contribution of medical or substance-related conditions to the anxiety. ?Target Date: 2022-04-25 ?Frequency: Daily ?Modality: individual ?Progress: 90%  ?Related Problem:  Learn and implement coping skills that result in a reduction of anxiety and worry, and improved daily functioning. ?Description: Describe situations, thoughts, feelings, and actions associated with anxieties and worries, their impact on functioning, and attempts to resolve them. ?Target Date: 2022-04-25 ?Frequency: Daily ?Modality: individual ?Progress: 70% ? ?Client Response ?full compliance  ?Service Location ?Location, [address] [place], [place] [address]  ?Service Code ?cpt [id number] P ?Identify automatic thoughts  ?Facilitate problem solving  ?Session Notes:  ?Dx.: Generalized Anxiety  ?Meds.: Xanax  ?Patient states that he would like to have a Webex video session due to the pandemic. He is at home and I am at my home office.  ?Goals: States that he is seeking therapy to reduce extreme anxiety and the associated cognitive and physical distress. Target date is 5-22. Revised target is 12-23. ?Session notes: ?Ashby says he is going through one of those tough times....daughter is sick, washer is out, and son has been difficult. His son quit his job before he got started. 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 He is also frustrated with his wife who he feels never owns responsibility for anything that goes wrong. "She never apologizes" for anything. We talked about how to respond and avoid further conflict. ?We also addressed the strategy to deal with his son. He has followed through with his established consequences for his son not following through with expectations. Told him to hold the course and not over-react. He needs to be consistent and follow through with threats.  ?He has his father, sister and 2 nephews coming to visit for the Easter weekend and the following week. They will stay in his house. He will close office on Good Friday and Easter Monday, but "probably" work rest of the time. His father is the wild card in the visit due to their history. His father can make comments that get under  Gottfried's skin. Many of his comments are jabs and  no expression of pride or support. Treyvonne is thinking it may be better with his sister here. He treat Kohle differently than his sister.         ?          ?  ?    ? ? ?Garrel Ridgel, PhD Time: 11:40-12:30p 50 min. ? ? ? ? ? ? ? ? ? ? ? ? ? ? ?

## 2021-08-01 DIAGNOSIS — G4733 Obstructive sleep apnea (adult) (pediatric): Secondary | ICD-10-CM | POA: Diagnosis not present

## 2021-08-04 ENCOUNTER — Other Ambulatory Visit: Payer: Self-pay | Admitting: Family Medicine

## 2021-08-04 DIAGNOSIS — F419 Anxiety disorder, unspecified: Secondary | ICD-10-CM

## 2021-08-04 MED ORDER — ALPRAZOLAM 0.25 MG PO TABS: 0.2500 mg | ORAL_TABLET | Freq: Every evening | ORAL | 0 refills | Status: DC | PRN

## 2021-08-04 NOTE — Telephone Encounter (Signed)
[facility] is requesting to fill pt xanax. Please advise KH 

## 2021-08-14 DIAGNOSIS — H5213 Myopia, bilateral: Secondary | ICD-10-CM | POA: Diagnosis not present

## 2021-09-05 ENCOUNTER — Inpatient Hospital Stay: Admission: RE | Admit: 2021-09-05 | Payer: 59 | Source: Ambulatory Visit

## 2021-09-09 ENCOUNTER — Ambulatory Visit (INDEPENDENT_AMBULATORY_CARE_PROVIDER_SITE_OTHER): Payer: 59 | Admitting: Psychology

## 2021-09-09 DIAGNOSIS — F411 Generalized anxiety disorder: Secondary | ICD-10-CM

## 2021-09-09 NOTE — Progress Notes (Addendum)
09/09/2021  Treatment Plan: Diagnosis F41.1 (Generalized anxiety disorder) [n/a]  Symptoms Excessive and/or unrealistic worry that is difficult to control occurring more days than not for at least 6 months about a number of events or activities. (Status: maintained) -- No Description Entered  Hypervigilance (e.g., feeling constantly on edge, experiencing concentration difficulties, having trouble falling or staying asleep, exhibiting a general state of irritability). (Status: maintained) -- No Description Entered  Medication Status compliance  Safety none  If Suicidal or Homicidal State Action Taken: unspecified  Current Risk: low Medications xanax (Dosage: unknown)  Objectives Related Problem: Learn and implement coping skills that result in a reduction of anxiety and worry, and improved daily functioning. Description: Reestablish a consistent sleep-wake cycle. Target Date: 2022-04-25 Frequency: Daily Modality: individual Progress: 20%  Related Problem: Learn and implement coping skills that result in a reduction of anxiety and worry, and improved daily functioning. Description: Maintain involvement in work, family, and social activities. Target Date: 2022-04-25 Frequency: Daily Modality: individual Progress: 50%  Related Problem: Learn and implement coping skills that result in a reduction of anxiety and worry, and improved daily functioning. Description: Learn and implement problem-solving strategies for realistically addressing worries. Target Date: 2022-04-25 Frequency: Daily Modality: individual Progress: 60%  Related Problem: Learn and implement coping skills that result in a reduction of anxiety and worry, and improved daily functioning. Description: Complete a medical evaluation to assess for possible contribution of medical or substance-related conditions to the anxiety. Target Date: 2022-04-25 Frequency: Daily Modality:  individual Progress: 90%  Related Problem: Learn and implement coping skills that result in a reduction of anxiety and worry, and improved daily functioning. Description: Describe situations, thoughts, feelings, and actions associated with anxieties and worries, their impact on functioning, and attempts to resolve them. Target Date: 2022-04-25 Frequency: Daily Modality: individual Progress: 70%  Client Response full compliance  Service Location Location, [address] [place], [place] [address]  Service Code cpt [phone] P Identify automatic thoughts  Facilitate problem solving  Session Notes:  Dx.: Generalized Anxiety  Meds.: Xanax  Patient states that he would like to have a Webex video session due to the pandemic. He is at home and I am at my home office.  Goals: States that he is seeking therapy to reduce extreme anxiety and the associated cognitive and physical distress. Target date is 5-22. Revised target is 12-23. Patient agrees to a Brewing technologist session. He is at his office and proveder is in his home office. 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 The family visit to his house was "horrible". He says his sister's kid is out of control. He said his father acted like an ass. His father was chronically worried about Afib and did not bring his medicine. Father ended up leaving a day early. He says that there is animosity toward him from his father. Yahsir feels it was because his mother got pregnant with him when she was a teenager and his father resents him. We talked about ways to keep relationship in perspective and not allow it to interfere in his life. Reports stable moods and not current somatic episodes. Anxiety is in check.  Garrel Ridgel, PhD Time: 5:10-6:00p 50 min.

## 2021-09-18 ENCOUNTER — Ambulatory Visit: Payer: 59 | Admitting: Family Medicine

## 2021-09-18 ENCOUNTER — Encounter: Payer: Self-pay | Admitting: Family Medicine

## 2021-09-18 VITALS — BP 128/88 | HR 74 | Temp 97.0°F | Wt 206.4 lb

## 2021-09-18 DIAGNOSIS — L723 Sebaceous cyst: Secondary | ICD-10-CM

## 2021-09-18 DIAGNOSIS — B86 Scabies: Secondary | ICD-10-CM | POA: Diagnosis not present

## 2021-09-18 MED ORDER — PERMETHRIN 5 % EX CREA: 1.0000 "application " | TOPICAL_CREAM | Freq: Once | CUTANEOUS | 1 refills | Status: AC

## 2021-09-18 NOTE — Progress Notes (Signed)
   Subjective:    Patient ID: Arthur Ho, male    DOB: [birth year], 46 y.o.   MRN: [id number]  HPI He has a lesion on his scalp that he would like me to evaluate.  He also notes a rash on his hands mainly on his fingers.   Review of Systems     Objective:   Physical Exam Exam of the scalp does show a 3 cm round smooth movable lesion with a central dimple.  Exam of his hands does show a few erythematous lesions in the interspace between the fingers and also on the mid portion of several of his fingers.       Assessment & Plan:  Scabies - Plan: permethrin (ELIMITE) 5 % cream  Sebaceous cyst I explained that I thought he had scabies and proper treatment would be the use of the topical medication.  I explained how to use the medication and the possible need to repeat it in 2 weeks. I discussed the fact that the lesion on the scalp is a sebaceous cyst and it is benign and no need for removal unless it continues to grow given trouble or becomes infected.

## 2021-09-30 ENCOUNTER — Ambulatory Visit (INDEPENDENT_AMBULATORY_CARE_PROVIDER_SITE_OTHER): Payer: 59 | Admitting: Psychology

## 2021-09-30 DIAGNOSIS — F411 Generalized anxiety disorder: Secondary | ICD-10-CM | POA: Diagnosis not present

## 2021-09-30 NOTE — Progress Notes (Signed)
09/30/2021  Treatment Plan: Diagnosis F41.1 (Generalized anxiety disorder) [n/a]  Symptoms Excessive and/or unrealistic worry that is difficult to control occurring more days than not for at least 6 months about a number of events or activities. (Status: maintained) -- No Description Entered  Hypervigilance (e.g., feeling constantly on edge, experiencing concentration difficulties, having trouble falling or staying asleep, exhibiting a general state of irritability). (Status: maintained) -- No Description Entered  Medication Status compliance  Safety none  If Suicidal or Homicidal State Action Taken: unspecified  Current Risk: low Medications xanax (Dosage: unknown)  Objectives Related Problem: Learn and implement coping skills that result in a reduction of anxiety and worry, and improved daily functioning. Description: Reestablish a consistent sleep-wake cycle. Target Date: 2022-04-25 Frequency: Daily Modality: individual Progress: 20%  Related Problem: Learn and implement coping skills that result in a reduction of anxiety and worry, and improved daily functioning. Description: Maintain involvement in work, family, and social activities. Target Date: 2022-04-25 Frequency: Daily Modality: individual Progress: 50%  Related Problem: Learn and implement coping skills that result in a reduction of anxiety and worry, and improved daily functioning. Description: Learn and implement problem-solving strategies for realistically addressing worries. Target Date: 2022-04-25 Frequency: Daily Modality: individual Progress: 60%  Related Problem: Learn and implement coping skills that result in a reduction of anxiety and worry, and improved daily functioning. Description: Complete a medical evaluation to assess for possible contribution of medical or substance-related conditions to the anxiety. Target Date: 2022-04-25 Frequency:  Daily Modality: individual Progress: 90%  Related Problem: Learn and implement coping skills that result in a reduction of anxiety and worry, and improved daily functioning. Description: Describe situations, thoughts, feelings, and actions associated with anxieties and worries, their impact on functioning, and attempts to resolve them. Target Date: 2022-04-25 Frequency: Daily Modality: individual Progress: 70%  Client Response full compliance  Service Location Location, [address] [place], [place] [address]  Service Code cpt [phone] P Identify automatic thoughts  Facilitate problem solving  Session Notes:  Dx.: Generalized Anxiety  Meds.: Xanax  Patient states that he would like to have a Webex video session due to the pandemic. He is at home and I am at my home office.  Goals: States that he is seeking therapy to reduce extreme anxiety and the associated cognitive and physical distress. Tends to have a variety of somatic complaints that he hopes to resolve. Will utilize FOO exploration and cognitive behavioral strategies. Target date is 5-22. Revised target is 12-23. Session notes: Kevork talked about his frustrations with his FOO. He is doing a good job a separating emotionally from them. He no longer lets his family's "antics" cause him distress or make him feel rejected. He remains positive about his own family and commitment to be different than his own father.  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 He is very pleased that he no longer has any cardiac symptoms or physical problems. His anxiety is also manageable. I shaving some sleep problems, but he says it is not related to anxiety. He reports much greater energy than before. Talked about ways to get sleep back on track.  Garrel Ridgel, PhD Time: 11:40a-12:30p 50 min.

## 2021-10-03 ENCOUNTER — Inpatient Hospital Stay: Admission: RE | Admit: 2021-10-03 | Payer: 59 | Source: Ambulatory Visit

## 2021-10-06 ENCOUNTER — Encounter: Payer: Self-pay | Admitting: Family Medicine

## 2021-10-06 ENCOUNTER — Ambulatory Visit (INDEPENDENT_AMBULATORY_CARE_PROVIDER_SITE_OTHER): Payer: 59 | Admitting: Family Medicine

## 2021-10-06 VITALS — BP 110/70 | HR 72 | Temp 97.2°F | Ht 69.0 in | Wt 209.2 lb

## 2021-10-06 DIAGNOSIS — Z1159 Encounter for screening for other viral diseases: Secondary | ICD-10-CM

## 2021-10-06 DIAGNOSIS — Z0000 Encounter for general adult medical examination without abnormal findings: Secondary | ICD-10-CM

## 2021-10-06 DIAGNOSIS — Z87442 Personal history of urinary calculi: Secondary | ICD-10-CM | POA: Diagnosis not present

## 2021-10-06 DIAGNOSIS — K219 Gastro-esophageal reflux disease without esophagitis: Secondary | ICD-10-CM | POA: Diagnosis not present

## 2021-10-06 DIAGNOSIS — E782 Mixed hyperlipidemia: Secondary | ICD-10-CM

## 2021-10-06 DIAGNOSIS — F419 Anxiety disorder, unspecified: Secondary | ICD-10-CM | POA: Diagnosis not present

## 2021-10-06 DIAGNOSIS — G4733 Obstructive sleep apnea (adult) (pediatric): Secondary | ICD-10-CM

## 2021-10-06 DIAGNOSIS — Z2821 Immunization not carried out because of patient refusal: Secondary | ICD-10-CM | POA: Diagnosis not present

## 2021-10-06 DIAGNOSIS — R002 Palpitations: Secondary | ICD-10-CM | POA: Diagnosis not present

## 2021-10-06 NOTE — Progress Notes (Signed)
Complete physical exam  Patient: Arthur Ho   DOB: [birth year]   46 y.o. Male  MRN: [id number]  Subjective:    Arthur Ho is a 46 y.o. male who presents today for a complete physical exam. He reports consuming a general, low fat, and low sodium diet. Home exercise routine includes walking 1-2 hrs per day. He generally feels well. He reports sleeping well.  CPAP is working very well for him and he is very happy with this.  He does have reflux disease but presently is on no medication.  Psychologically he has been involved in counseling and feels very good about the progress that he is made.  He rarely uses Xanax.  Apparently the palpitations was precipitated checking his sleep status are not giving him any difficulty..  He has a history of kidney stones but none recently.   Most recent fall risk assessment:     View : No data to display.           Most recent depression screenings:    09/18/2021   11:21 AM 02/21/2020   10:36 AM  PHQ 2/9 Scores  PHQ - 2 Score 0 0      Patient Active Problem List   Diagnosis Date Noted   Hyperlipidemia 11/01/2020   Obstructive sleep apnea 09/13/2020   Palpitations 08/01/2020   Anxiety 12/26/2014   GERD (gastroesophageal reflux disease) 10/29/2014   History of kidney stones 04/13/2012   Past Medical History:  Diagnosis Date   Calcium nephrolithiasis    GERD (gastroesophageal reflux disease)    Past Surgical History:  Procedure Laterality Date   HERNIA REPAIR     R inguinal   KIDNEY STONE SURGERY     basket retrieval x several   LITHOTRIPSY     x2   Social History   Tobacco Use   Smoking status: Former    Types: Cigarettes    Quit date: 05/04/2008    Years since quitting: 13.4   Smokeless tobacco: Never  Vaping Use   Vaping Use: Never used  Substance Use Topics   Alcohol use: No   Drug use: No   Family Status  Relation Name Status   MGF  (Not Specified)   PGF  (Not Specified)   Mother  Alive   Father  Alive    Brother  Alive   Sister  Alive   Family History  Problem Relation Age of Onset   Allergies Maternal Grandfather    Vision loss Maternal Grandfather    Emphysema Maternal Grandfather    Nephrolithiasis Maternal Grandfather    Colon cancer Paternal Grandfather    Atrial fibrillation Father    Arrhythmia Brother    Allergies  Allergen Reactions   Contrast Media [Iodinated Contrast Media] Palpitations    SOB   Penicillins Anaphylaxis   Sulfa Antibiotics     Patient Care Team: Ronnald Nian, MD as PCP - General (Family Medicine) Runell Gess, MD as PCP - Cardiology (Cardiology)   Outpatient Medications Prior to Visit  Medication Sig   ALPRAZolam (XANAX) 0.25 MG tablet Take 1 tablet (0.25 mg total) by mouth at bedtime as needed for anxiety.   No facility-administered medications prior to visit.    Review of Systems  All other systems reviewed and are negative.        Objective:    Alert and in no distress. Tympanic membranes and canals are normal. Pharyngeal area is normal. Neck is supple without adenopathy or thyromegaly.  Cardiac exam shows a regular sinus rhythm without murmurs or gallops. Lungs are clear to auscultation. Abdominal and genital exam normal  BP Readings from Last 3 Encounters:  09/18/21 128/88  07/18/21 118/80  11/01/20 122/71      Physical Exam   No results found for any visits on 10/06/21. Last CBC Lab Results  Component Value Date   WBC 8.1 02/02/2020   HGB 16.0 02/02/2020   HCT 46.4 02/02/2020   MCV 91 02/02/2020   MCH 31.4 02/02/2020   RDW 11.9 02/02/2020   PLT 341 02/02/2020   Last metabolic panel Lab Results  Component Value Date   GLUCOSE 89 02/02/2020   NA 142 02/02/2020   K 4.9 02/02/2020   CL 103 02/02/2020   CO2 25 02/02/2020   BUN 16 02/02/2020   CREATININE 1.04 02/02/2020   GFRNONAA 88 02/02/2020   CALCIUM 9.8 02/02/2020   PROT 7.3 07/18/2021   ALBUMIN 4.9 07/18/2021   LABGLOB 2.5 02/02/2020   AGRATIO 2.0  02/02/2020   BILITOT 0.3 07/18/2021   ALKPHOS 64 07/18/2021   AST 21 07/18/2021   ALT 29 07/18/2021   ANIONGAP 12 10/10/2014   Last lipids Lab Results  Component Value Date   CHOL 225 (H) 07/18/2021   HDL 51 07/18/2021   LDLCALC 158 (H) 07/18/2021   TRIG 92 07/18/2021   CHOLHDL 4.4 07/18/2021        Assessment & Plan:    Routine general medical examination at a health care facility - Plan: CBC with Differential/Platelet, Comprehensive metabolic panel, Lipid panel  Obstructive sleep apnea  Gastroesophageal reflux disease, unspecified whether esophagitis present  Anxiety  History of kidney stones  Mixed hyperlipidemia - Plan: Lipid panel  Palpitations  Need for hepatitis C screening test - Plan: Hepatitis C antibody  Immunization refused I congratulated him on the psychological work that he has been doing.  He will continue on his CPAP.  Occasional use of Xanax is certainly okay. Immunization History  Administered Date(s) Administered   Influenza-Unspecified 03/26/2021   Tdap 02/15/2017    Health Maintenance  Topic Date Due   COVID-19 Vaccine (1) Never done   Hepatitis C Screening  Never done   HIV Screening  09/19/2022 (Originally 11/22/1990)   INFLUENZA VACCINE  12/02/2021   TETANUS/TDAP  02/16/2027   HPV VACCINES  Aged Out    Discussed health benefits of physical activity, and encouraged him to engage in regular exercise appropriate for his age and condition.  I congratulated him on the psychological work that he has been doing and encouraged him to continue with his present lifestyle.     Renelda Loma, RMA

## 2021-10-07 ENCOUNTER — Other Ambulatory Visit: Payer: Self-pay

## 2021-10-07 DIAGNOSIS — Z1159 Encounter for screening for other viral diseases: Secondary | ICD-10-CM | POA: Diagnosis not present

## 2021-10-07 DIAGNOSIS — Z0000 Encounter for general adult medical examination without abnormal findings: Secondary | ICD-10-CM | POA: Diagnosis not present

## 2021-10-07 DIAGNOSIS — E782 Mixed hyperlipidemia: Secondary | ICD-10-CM | POA: Diagnosis not present

## 2021-10-07 LAB — COMPREHENSIVE METABOLIC PANEL
ALT: 28 IU/L (ref 0–44)
AST: 21 IU/L (ref 0–40)
Albumin/Globulin Ratio: 1.9 (ref 1.2–2.2)
Albumin: 5 g/dL (ref 4.0–5.0)
Alkaline Phosphatase: 71 IU/L (ref 44–121)
BUN/Creatinine Ratio: 18 (ref 9–20)
BUN: 21 mg/dL (ref 6–24)
Bilirubin Total: 0.4 mg/dL (ref 0.0–1.2)
CO2: 25 mmol/L (ref 20–29)
Calcium: 9.9 mg/dL (ref 8.7–10.2)
Chloride: 103 mmol/L (ref 96–106)
Creatinine, Ser: 1.15 mg/dL (ref 0.76–1.27)
Globulin, Total: 2.6 g/dL (ref 1.5–4.5)
Glucose: 106 mg/dL — ABNORMAL HIGH (ref 70–99)
Potassium: 4.7 mmol/L (ref 3.5–5.2)
Sodium: 139 mmol/L (ref 134–144)
Total Protein: 7.6 g/dL (ref 6.0–8.5)
eGFR: 80 mL/min/{1.73_m2} (ref >59)

## 2021-10-07 LAB — CBC WITH DIFFERENTIAL/PLATELET
Basophils Absolute: 0.1 10*3/uL (ref 0.0–0.2)
Basos: 1 %
EOS (ABSOLUTE): 0.2 10*3/uL (ref 0.0–0.4)
Eos: 2 %
Hematocrit: 45.3 % (ref 37.5–51.0)
Hemoglobin: 15.5 g/dL (ref 13.0–17.7)
Lymphocytes Absolute: 1.5 10*3/uL (ref 0.7–3.1)
Lymphs: 21 %
MCH: 31.8 pg (ref 26.6–33.0)
MCHC: 34.2 g/dL (ref 31.5–35.7)
MCV: 93 fL (ref 79–97)
Monocytes Absolute: 0.7 10*3/uL (ref 0.1–0.9)
Monocytes: 10 %
Neutrophils Absolute: 4.9 10*3/uL (ref 1.4–7.0)
Neutrophils: 66 %
Platelets: 316 10*3/uL (ref 150–450)
RBC: 4.88 x10E6/uL (ref 4.14–5.80)
RDW: 12.8 % (ref 11.6–15.4)
WBC: 7.4 10*3/uL (ref 3.4–10.8)

## 2021-10-08 LAB — LIPID PANEL
Chol/HDL Ratio: 5 ratio (ref 0.0–5.0)
Cholesterol, Total: 239 mg/dL — ABNORMAL HIGH (ref 100–199)
HDL: 48 mg/dL (ref >39)
LDL Chol Calc (NIH): 173 mg/dL — ABNORMAL HIGH (ref 0–99)
Triglycerides: 103 mg/dL (ref 0–149)
VLDL Cholesterol Cal: 18 mg/dL (ref 5–40)

## 2021-10-08 LAB — HEPATITIS C ANTIBODY: Hep C Virus Ab: NONREACTIVE

## 2021-10-17 ENCOUNTER — Encounter: Payer: Self-pay | Admitting: Family Medicine

## 2021-10-20 ENCOUNTER — Other Ambulatory Visit: Payer: Self-pay

## 2021-10-20 DIAGNOSIS — R21 Rash and other nonspecific skin eruption: Secondary | ICD-10-CM

## 2021-10-20 NOTE — Telephone Encounter (Signed)
Done kh

## 2021-10-22 ENCOUNTER — Ambulatory Visit: Payer: 59 | Admitting: Psychology

## 2021-10-26 DIAGNOSIS — G4733 Obstructive sleep apnea (adult) (pediatric): Secondary | ICD-10-CM | POA: Diagnosis not present

## 2021-10-28 DIAGNOSIS — L308 Other specified dermatitis: Secondary | ICD-10-CM | POA: Diagnosis not present

## 2021-11-06 ENCOUNTER — Telehealth: Payer: Self-pay

## 2021-11-06 DIAGNOSIS — E782 Mixed hyperlipidemia: Secondary | ICD-10-CM

## 2021-11-06 MED ORDER — ATORVASTATIN CALCIUM 40 MG PO TABS: 40.0000 mg | ORAL_TABLET | Freq: Every day | ORAL | 3 refills | Status: DC

## 2021-11-06 NOTE — Telephone Encounter (Signed)
Spoke with pt regarding recent lab results and Dr. Hazle Coca recommendations. Per Dr. Allyson Sabal, pt was supposed to get a calcium score, however pt states that he has been extremely busy and was unable to get this done. Per Dr. Allyson Sabal, based on lab results would like to start pt on statin therapy. Atorvastatin 40mg  once daily prescription sent to pt's pharmacy of choice. Will plan to get labs in 3 months, lab orders placed and mailed to pt's home address. Pt verbalizes understanding.

## 2021-11-06 NOTE — Telephone Encounter (Signed)
-----   Message from Runell Gess, MD sent at 07/18/2021  5:42 PM EDT ----- LDL unchanged despite dietary modification. Begin atorva 40 mg and re check FLP 3 months. Get CCS

## 2021-11-19 ENCOUNTER — Ambulatory Visit (INDEPENDENT_AMBULATORY_CARE_PROVIDER_SITE_OTHER): Payer: 59 | Admitting: Psychology

## 2021-11-19 DIAGNOSIS — F411 Generalized anxiety disorder: Secondary | ICD-10-CM

## 2021-11-19 NOTE — Progress Notes (Signed)
      11/19/2021  Treatment Plan: Diagnosis F41.1 (Generalized anxiety disorder) [n/a]  Symptoms Excessive and/or unrealistic worry that is difficult to control occurring more days than not for at least 6 months about a number of events or activities. (Status: maintained) -- No Description Entered  Hypervigilance (e.g., feeling constantly on edge, experiencing concentration difficulties, having trouble falling or staying asleep, exhibiting a general state of irritability). (Status: maintained) -- No Description Entered  Medication Status compliance  Safety none  If Suicidal or Homicidal State Action Taken: unspecified  Current Risk: low Medications xanax (Dosage: unknown)  Objectives Related Problem: Learn and implement coping skills that result in a reduction of anxiety and worry, and improved daily functioning. Description: Reestablish a consistent sleep-wake cycle. Target Date: 2022-04-25 Frequency: Daily Modality: individual Progress: 20%  Related Problem: Learn and implement coping skills that result in a reduction of anxiety and worry, and improved daily functioning. Description: Maintain involvement in work, family, and social activities. Target Date: 2022-04-25 Frequency: Daily Modality: individual Progress: 50%  Related Problem: Learn and implement coping skills that result in a reduction of anxiety and worry, and improved daily functioning. Description: Learn and implement problem-solving strategies for realistically addressing worries. Target Date: 2022-04-25 Frequency: Daily Modality: individual Progress: 60%  Related Problem: Learn and implement coping skills that result in a reduction of anxiety and worry, and improved daily functioning. Description: Complete a medical evaluation to assess for possible contribution of medical or substance-related conditions to the anxiety. Target Date: 2022-04-25 Frequency: Daily Modality: individual Progress: 90%  Related  Problem: Learn and implement coping skills that result in a reduction of anxiety and worry, and improved daily functioning. Description: Describe situations, thoughts, feelings, and actions associated with anxieties and worries, their impact on functioning, and attempts to resolve them. Target Date: 2022-04-25 Frequency: Daily Modality: individual Progress: 70%  Client Response full compliance  Service Location Location, [address] [place], [place] [address]  Service Code cpt [phone] P Identify automatic thoughts  Facilitate problem solving  Session Notes:  Dx.: Generalized Anxiety  Meds.: Xanax  Patient states that he would like to have a Webex video session. He is at home and I am at my home office.  Goals: States that he is seeking therapy to reduce extreme anxiety and the associated cognitive and physical distress. Tends to have a variety of somatic complaints that he hopes to resolve. Will utilize FOO exploration and cognitive behavioral strategies. Target date is 5-22. Revised target is 12-23. 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 He and his wife have had a few disconnects and got very angry with each other. The worked it through, but he feels she rarely or never acknowledges fault.  He has been very busy with work and pleased about the ownership over the past year. His moods are good and he is feeling confident about himself and his health. Requests to check in monthly to sustain progress.                                          Garrel Ridgel, PhD Time: 11:45a-12:30p 45 min.

## 2021-12-01 ENCOUNTER — Other Ambulatory Visit: Payer: Self-pay | Admitting: Family Medicine

## 2021-12-01 DIAGNOSIS — F419 Anxiety disorder, unspecified: Secondary | ICD-10-CM

## 2021-12-02 MED ORDER — ALPRAZOLAM 0.25 MG PO TABS: 0.2500 mg | ORAL_TABLET | Freq: Every evening | ORAL | 0 refills | Status: DC | PRN

## 2021-12-17 ENCOUNTER — Ambulatory Visit: Payer: 59 | Admitting: Psychology

## 2021-12-22 ENCOUNTER — Ambulatory Visit (INDEPENDENT_AMBULATORY_CARE_PROVIDER_SITE_OTHER): Payer: 59 | Admitting: Psychology

## 2021-12-22 DIAGNOSIS — F411 Generalized anxiety disorder: Secondary | ICD-10-CM

## 2021-12-22 NOTE — Progress Notes (Signed)
12/22/2021  Treatment Plan: Diagnosis F41.1 (Generalized anxiety disorder) [n/a]  Symptoms Excessive and/or unrealistic worry that is difficult to control occurring more days than not for at least 6 months about a number of events or activities. (Status: maintained) -- No Description Entered  Hypervigilance (e.g., feeling constantly on edge, experiencing concentration difficulties, having trouble falling or staying asleep, exhibiting a general state of irritability). (Status: maintained) -- No Description Entered  Medication Status compliance  Safety none  If Suicidal or Homicidal State Action Taken: unspecified  Current Risk: low Medications xanax (Dosage: unknown)  Objectives Related Problem: Learn and implement coping skills that result in a reduction of anxiety and worry, and improved daily functioning. Description: Reestablish a consistent sleep-wake cycle. Target Date: 2022-04-25 Frequency: Daily Modality: individual Progress: 20%  Related Problem: Learn and implement coping skills that result in a reduction of anxiety and worry, and improved daily functioning. Description: Maintain involvement in work, family, and social activities. Target Date: 2022-04-25 Frequency: Daily Modality: individual Progress: 50%  Related Problem: Learn and implement coping skills that result in a reduction of anxiety and worry, and improved daily functioning. Description: Learn and implement problem-solving strategies for realistically addressing worries. Target Date: 2022-04-25 Frequency: Daily Modality: individual Progress: 60%  Related Problem: Learn and implement coping skills that result in a reduction of anxiety and worry, and improved daily functioning. Description: Complete a medical evaluation to assess for possible contribution of medical or substance-related conditions to the anxiety. Target Date: 2022-04-25 Frequency: Daily Modality: individual Progress:  90%  Related Problem: Learn and implement coping skills that result in a reduction of anxiety and worry, and improved daily functioning. Description: Describe situations, thoughts, feelings, and actions associated with anxieties and worries, their impact on functioning, and attempts to resolve them. Target Date: 2022-04-25 Frequency: Daily Modality: individual Progress: 70%  Client Response full compliance  Service Location Location, [address] [place], [place] [address]  Service Code cpt [phone] P Identify automatic thoughts  Facilitate problem solving  Session Notes:  Dx.: Generalized Anxiety  Meds.: Xanax  Patient states that he would like to have a Webex video session. He is at home and I am at my home office.  Goals: States that he is seeking therapy to reduce extreme anxiety and the associated cognitive and physical distress. Tends to have a variety of somatic complaints that he hopes to resolve. Will utilize FOO exploration and cognitive behavioral strategies. Target date is 5-22. Revised target is 12-23. Session notes: Arthur Ho went to a concert with his wife and son. It was sold out and in the past it would have created huge amount of anxiety. He was able to manage well and says he had one of the "best times" of his life. He states that work is going well and he is very busy. He had a few days a couple of weeks ago where he felt very anxious "for no reasons he could identify". He was having heart palpitations and physical symptoms. He was thinking that it may have been an indication that he needed a break. It dissipated on it's own. He took some Xanax once he started to have physical symptoms. His wife has "finally" decided to work part time. He feels this will make his home life much better with less stress at home. She will help him with the kids at activities and getting kids ready for school.  He is feeling good and more confident.  Not overly stressed by his episode of anxiety.                                              Garrel Ridgel, PhD Time: 11:45a-12:30p 45 min.

## 2022-01-07 ENCOUNTER — Encounter: Payer: Self-pay | Admitting: Internal Medicine

## 2022-01-09 ENCOUNTER — Encounter: Payer: Self-pay | Admitting: Medical

## 2022-01-09 ENCOUNTER — Telehealth: Payer: 59 | Admitting: Medical

## 2022-01-09 VITALS — Temp 98.7°F | Wt 190.0 lb

## 2022-01-09 DIAGNOSIS — L089 Local infection of the skin and subcutaneous tissue, unspecified: Secondary | ICD-10-CM | POA: Diagnosis not present

## 2022-01-09 DIAGNOSIS — Z8614 Personal history of Methicillin resistant Staphylococcus aureus infection: Secondary | ICD-10-CM

## 2022-01-09 MED ORDER — CHLORHEXIDINE GLUCONATE 4 % EX LIQD
Freq: Every day | CUTANEOUS | 1 refills | Status: DC | PRN
Start: 2022-01-09 — End: 2022-10-21

## 2022-01-09 MED ORDER — MUPIROCIN 2 % EX OINT: 1.0000 | TOPICAL_OINTMENT | Freq: Two times a day (BID) | CUTANEOUS | 1 refills | Status: DC

## 2022-01-09 MED ORDER — DOXYCYCLINE HYCLATE 100 MG PO TABS: 100.0000 mg | ORAL_TABLET | Freq: Two times a day (BID) | ORAL | 1 refills | Status: DC

## 2022-01-09 NOTE — Progress Notes (Signed)
Subjective:     Patient ID: Arthur Ho, male   DOB: 1975/12/19, 46 y.o.   MRN: [id number]  This visit type was conducted due to national recommendations for restrictions regarding the COVID-19 Pandemic (e.g. social distancing) in an effort to limit this patient's exposure and mitigate transmission in our community.  Due to their co-morbid illnesses, this patient is at least at moderate risk for complications without adequate follow up.  This format is felt to be most appropriate for this patient at this time.    Documentation for virtual audio and video telecommunications through [place] encounter:  The patient was located at home. The provider was located in the office. The patient did consent to this visit and is aware of possible charges through their insurance for this visit.  The other persons participating in this telemedicine service were none. Time spent on call was 20 minutes and in review of previous records 20 minutes total.  This virtual service is not related to other E/M service within previous 7 days.   HPI Chief Complaint  Patient presents with   bump on arm    Bump on arm- last time had one of these was positive for MRSA   Virtual for skin lesion.  Has had hx/o MRSA bump in past.  Has area on arm that is red, warm, tender on forearm just below elbow of left arm.  Wife works as a Best boy at [employer], so they both feel this could be MRSA.  No recent injury or trauma.  Felt like zit a first, then it has gotten worse.   No drainage.  No open wound.  No skin contacts with MRSA recently.   No other aggravating or relieving factors. No other complaint.   Past Medical History:  Diagnosis Date   Calcium nephrolithiasis    GERD (gastroesophageal reflux disease)    Current Outpatient Medications on File Prior to Visit  Medication Sig Dispense Refill   ALPRAZolam (XANAX) 0.25 MG tablet Take 1 tablet (0.25 mg total) by mouth at bedtime as needed for anxiety. 30 tablet 0    atorvastatin (LIPITOR) 40 MG tablet Take 1 tablet (40 mg total) by mouth daily. 90 tablet 3   No current facility-administered medications on file prior to visit.    Review of Systems As in subjective    Objective:   Physical Exam Due to coronavirus pandemic stay at home measures, patient visit was virtual and they were not examined in person.   Temp 98.7 F (37.1 C)   Wt 190 lb (86.2 kg)   BMI 28.06 kg/m   We had difficulty with the video and imaging today.      Assessment:     Encounter Diagnoses  Name Primary?   Skin infection Yes   History of MRSA infection        Plan:     He has had MRSA skin infections before and this lesion seems to be headed that way pretty quickly.  He will begin doxycycline oral antibiotic, mupirocin and nasal mucosa for the next 5 to 7 days, body wash with Hibiclens.  Household contacts can also do Hibiclens body wash and mupirocin in the nose to kill colonizing and [place] allergic to the medication.  We discussed surface cleansing at home, disinfecting.  If worsening in the next 2 to 3 days then get reevaluated.  Josedejesus was seen today for bump on arm.  Diagnoses and all orders for this visit:  Skin infection  History of  MRSA infection  Other orders -     doxycycline (VIBRA-TABS) 100 MG tablet; Take 1 tablet (100 mg total) by mouth 2 (two) times daily. -     mupirocin ointment (BACTROBAN) 2 %; Apply 1 Application topically 2 (two) times daily. -     chlorhexidine (HIBICLENS) 4 % external liquid; Apply topically daily as needed.    F/u prn

## 2022-01-19 ENCOUNTER — Ambulatory Visit: Payer: 59 | Admitting: Psychology

## 2022-02-09 ENCOUNTER — Ambulatory Visit (INDEPENDENT_AMBULATORY_CARE_PROVIDER_SITE_OTHER): Payer: 59 | Admitting: Psychology

## 2022-02-09 DIAGNOSIS — F411 Generalized anxiety disorder: Secondary | ICD-10-CM | POA: Diagnosis not present

## 2022-02-09 NOTE — Progress Notes (Signed)
02/09/2022  Treatment Plan: Diagnosis F41.1 (Generalized anxiety disorder) [n/a]  Symptoms Excessive and/or unrealistic worry that is difficult to control occurring more days than not for at least 6 months about a number of events or activities. (Status: maintained) -- No Description Entered  Hypervigilance (e.g., feeling constantly on edge, experiencing concentration difficulties, having trouble falling or staying asleep, exhibiting a general state of irritability). (Status: maintained) -- No Description Entered  Medication Status compliance  Safety none  If Suicidal or Homicidal State Action Taken: unspecified  Current Risk: low Medications xanax (Dosage: unknown)  Objectives Related Problem: Learn and implement coping skills that result in a reduction of anxiety and worry, and improved daily functioning. Description: Reestablish a consistent sleep-wake cycle. Target Date: 2022-04-25 Frequency: Daily Modality: individual Progress: 20%  Related Problem: Learn and implement coping skills that result in a reduction of anxiety and worry, and improved daily functioning. Description: Maintain involvement in work, family, and social activities. Target Date: 2022-04-25 Frequency: Daily Modality: individual Progress: 50%  Related Problem: Learn and implement coping skills that result in a reduction of anxiety and worry, and improved daily functioning. Description: Learn and implement problem-solving strategies for realistically addressing worries. Target Date: 2022-04-25 Frequency: Daily Modality: individual Progress: 60%  Related Problem: Learn and implement coping skills that result in a reduction of anxiety and worry, and improved daily functioning. Description: Complete a medical evaluation to assess for possible contribution of medical or substance-related conditions to the anxiety. Target Date: 2022-04-25 Frequency: Daily Modality: individual Progress: 90%  Related  Problem: Learn and implement coping skills that result in a reduction of anxiety and worry, and improved daily functioning. Description: Describe situations, thoughts, feelings, and actions associated with anxieties and worries, their impact on functioning, and attempts to resolve them. Target Date: 2022-04-25 Frequency: Daily Modality: individual Progress: 70%  Client Response full compliance  Service Location Location, [address] [place], [place] [address]  Service Code cpt [phone] P Identify automatic thoughts  Facilitate problem solving  Session Notes:  Dx.: Generalized Anxiety  Meds.: Xanax  Patient states that he would like to have a Webex video session. He is at home and I am at my home office.  Goals: States that he is seeking therapy to reduce extreme anxiety and the associated cognitive and physical distress. Tends to have a variety of somatic complaints that he hopes to resolve. Will utilize FOO exploration and cognitive behavioral strategies. Target date is 5-22. Revised target is 12-23. Session notes: Maddock says that her daughter broke her arm a few weeks ago and had surgery. It has been very difficult and navigating the health care has been problematic. His wife has dropped to part time work out of frustration with her employer. 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 They saw his father and had a good experience, which is unusual. The biggest stressor s that his son is failing many of his subjects. This creates stress for Bhavik and he can feel the anxiety. He has not, however, felt he needs to take Xanax. It has been 3-4 week since he last took the Xanax. He is mostly sleeping better at night, but does wake up with racing thoughts. He is pleased about managing his anxiety and the success he is having at work.  Garrel Ridgel, PhD Time: 10:40a-11:30p 50 min.

## 2022-02-10 ENCOUNTER — Encounter: Payer: Self-pay | Admitting: Internal Medicine

## 2022-02-23 ENCOUNTER — Encounter: Payer: Self-pay | Admitting: Internal Medicine

## 2022-03-02 DIAGNOSIS — G4733 Obstructive sleep apnea (adult) (pediatric): Secondary | ICD-10-CM | POA: Diagnosis not present

## 2022-03-03 ENCOUNTER — Ambulatory Visit (INDEPENDENT_AMBULATORY_CARE_PROVIDER_SITE_OTHER): Payer: 59 | Admitting: Psychology

## 2022-03-03 DIAGNOSIS — F411 Generalized anxiety disorder: Secondary | ICD-10-CM

## 2022-03-03 NOTE — Progress Notes (Signed)
03/03/2022  Treatment Plan: Diagnosis F41.1 (Generalized anxiety disorder) [n/a]  Symptoms Excessive and/or unrealistic worry that is difficult to control occurring more days than not for at least 6 months about a number of events or activities. (Status: maintained) -- No Description Entered  Hypervigilance (e.g., feeling constantly on edge, experiencing concentration difficulties, having trouble falling or staying asleep, exhibiting a general state of irritability). (Status: maintained) -- No Description Entered  Medication Status compliance  Safety none  If Suicidal or Homicidal State Action Taken: unspecified  Current Risk: low Medications xanax (Dosage: unknown)  Objectives Related Problem: Learn and implement coping skills that result in a reduction of anxiety and worry, and improved daily functioning. Description: Reestablish a consistent sleep-wake cycle. Target Date: 2022-04-25 Frequency: Daily Modality: individual Progress: 20%  Related Problem: Learn and implement coping skills that result in a reduction of anxiety and worry, and improved daily functioning. Description: Maintain involvement in work, family, and social activities. Target Date: 2022-04-25 Frequency: Daily Modality: individual Progress: 50%  Related Problem: Learn and implement coping skills that result in a reduction of anxiety and worry, and improved daily functioning. Description: Learn and implement problem-solving strategies for realistically addressing worries. Target Date: 2022-04-25 Frequency: Daily Modality: individual Progress: 60%  Related Problem: Learn and implement coping skills that result in a reduction of anxiety and worry, and improved daily functioning. Description: Complete a medical evaluation to assess for possible contribution of medical or substance-related conditions to the anxiety. Target Date: 2022-04-25 Frequency: Daily Modality: individual Progress: 90%  Related  Problem: Learn and implement coping skills that result in a reduction of anxiety and worry, and improved daily functioning. Description: Describe situations, thoughts, feelings, and actions associated with anxieties and worries, their impact on functioning, and attempts to resolve them. Target Date: 2022-04-25 Frequency: Daily Modality: individual Progress: 70%  Client Response full compliance  Service Location Location, [address] [place], [place] [address]  Service Code cpt [phone] P Identify automatic thoughts  Facilitate problem solving  Session Notes:  Dx.: Generalized Anxiety  Meds.: Xanax  Patient states that he would like to have a video Webex video session. He is at home and I am at my home office.  Goals: States that he is seeking therapy to reduce extreme anxiety and the associated cognitive and physical distress. Tends to have a variety of somatic complaints that he hopes to resolve. Will utilize FOO exploration and cognitive behavioral strategies. Target date is 5-22. Revised target is 12-23. Session notes: Leland is very busy, but says business is good. He has a lot of frustrations and it is a challenge for him not to lose his temper. He is particularly frustrated with trying to get his daughter's medical bills and insurance to cooperate. His wife's mother is having some issues with her son. Keller is frustrated and worried that his mother in law will draw his wife into their dysfunctional situation. He feels he has improved greatly with his anger management. Anticipates he will manage well through some challenging times with his son, who continues to have some school difficulties. Justinn feels his son is lazy and not willing to work or put in an effort at school. He is an advocate of tough love and natural consequences and is committed to be consistent and hope his son makes better decisions.  Marcelina Morel, PhD Time:  10:40a-11:30p 50 min.

## 2022-03-13 ENCOUNTER — Other Ambulatory Visit: Payer: Self-pay | Admitting: Medical

## 2022-03-13 DIAGNOSIS — F419 Anxiety disorder, unspecified: Secondary | ICD-10-CM

## 2022-03-13 MED ORDER — ALPRAZOLAM 0.25 MG PO TABS: 0.2500 mg | ORAL_TABLET | Freq: Every evening | ORAL | 0 refills | Status: DC | PRN

## 2022-03-24 ENCOUNTER — Ambulatory Visit (INDEPENDENT_AMBULATORY_CARE_PROVIDER_SITE_OTHER): Payer: 59 | Admitting: Psychology

## 2022-03-24 DIAGNOSIS — F411 Generalized anxiety disorder: Secondary | ICD-10-CM

## 2022-03-24 NOTE — Progress Notes (Signed)
03/24/2022  Treatment Plan: Diagnosis F41.1 (Generalized anxiety disorder) [n/a]  Symptoms Excessive and/or unrealistic worry that is difficult to control occurring more days than not for at least 6 months about a number of events or activities. (Status: maintained) -- No Description Entered  Hypervigilance (e.g., feeling constantly on edge, experiencing concentration difficulties, having trouble falling or staying asleep, exhibiting a general state of irritability). (Status: maintained) -- No Description Entered  Medication Status compliance  Safety none  If Suicidal or Homicidal State Action Taken: unspecified  Current Risk: low Medications xanax (Dosage: unknown)  Objectives Related Problem: Learn and implement coping skills that result in a reduction of anxiety and worry, and improved daily functioning. Description: Reestablish a consistent sleep-wake cycle. Target Date: 2022-04-25 Frequency: Daily Modality: individual Progress: 20%  Related Problem: Learn and implement coping skills that result in a reduction of anxiety and worry, and improved daily functioning. Description: Maintain involvement in work, family, and social activities. Target Date: 2022-04-25 Frequency: Daily Modality: individual Progress: 50%  Related Problem: Learn and implement coping skills that result in a reduction of anxiety and worry, and improved daily functioning. Description: Learn and implement problem-solving strategies for realistically addressing worries. Target Date: 2022-04-25 Frequency: Daily Modality: individual Progress: 60%  Related Problem: Learn and implement coping skills that result in a reduction of anxiety and worry, and improved daily functioning. Description: Complete a medical evaluation to assess for possible contribution of medical or substance-related conditions to the anxiety. Target Date: 2022-04-25 Frequency: Daily Modality:  individual Progress: 90%  Related Problem: Learn and implement coping skills that result in a reduction of anxiety and worry, and improved daily functioning. Description: Describe situations, thoughts, feelings, and actions associated with anxieties and worries, their impact on functioning, and attempts to resolve them. Target Date: 2022-04-25 Frequency: Daily Modality: individual Progress: 70%  Client Response full compliance  Service Location Location, [address] [place], [place] [address]  Service Code cpt [id number] P Identify automatic thoughts  Facilitate problem solving  Session Notes:  Dx.: Generalized Anxiety  Meds.: Xanax  Patient states that he would like to have a video Webex video session. He is at home and I am at my home office.  Goals: States that he is seeking therapy to reduce extreme anxiety and the associated cognitive and physical distress. Tends to have a variety of somatic complaints that he hopes to resolve. Will utilize FOO exploration and cognitive behavioral strategies. Target date is 5-22. Revised target is 12-23. Session notes: Arthur Ho says his mom flew to town on Saturday and he is taking off time to spend with her. She is here for the holiday week. He loves having his mother here as they get along very well. He is "super busy" at work and is very gratified that things are going well.  He is very frustrated that his son is flunking many of his courses. He has challenged his son and told him he is "done" with his lack of effort. His son is "not even trying" and not turning in his work. Arthur Ho thinks his son is spending too much time with his girlfriend. He will be on the phone with his girlfriend for multiple hours/day. He feels powerless to get son to change. We talked about how/why to follow through with threats to son. He has also had a difficult interaction with his wife and she told him he has "anger management" problems. He  disagrees and said his distress was  unique to a particular circumstance. Arthur Ho acknowledges that he had an anger problem in the past, but that no longer is an issue. He is frustrated that his wife will "never apologize". They resolve their arguments, but her lack of apology is still upsetting.                                                     Garrel Ridgel, PhD Time: 11:40a-12:30p 50 min.

## 2022-05-18 ENCOUNTER — Ambulatory Visit (INDEPENDENT_AMBULATORY_CARE_PROVIDER_SITE_OTHER): Payer: Commercial Managed Care - PPO | Admitting: Psychology

## 2022-05-18 DIAGNOSIS — F411 Generalized anxiety disorder: Secondary | ICD-10-CM | POA: Diagnosis not present

## 2022-05-18 NOTE — Progress Notes (Signed)
05/18/2022  Treatment Plan: Diagnosis F41.1 (Generalized anxiety disorder) [n/a]  Symptoms Excessive and/or unrealistic worry that is difficult to control occurring more days than not for at least 6 months about a number of events or activities. (Status: maintained) -- No Description Entered  Hypervigilance (e.g., feeling constantly on edge, experiencing concentration difficulties, having trouble falling or staying asleep, exhibiting a general state of irritability). (Status: maintained) -- No Description Entered  Medication Status compliance  Safety none  If Suicidal or Homicidal State Action Taken: unspecified  Current Risk: low Medications xanax (Dosage: unknown)  Objectives Related Problem: Learn and implement coping skills that result in a reduction of anxiety and worry, and improved daily functioning. Description: Reestablish a consistent sleep-wake cycle. Target Date: 2023-04-26 Frequency: Daily Modality: individual Progress: 50%  Related Problem: Learn and implement coping skills that result in a reduction of anxiety and worry, and improved daily functioning. Description: Maintain involvement in work, family, and social activities. Target Date: 2023-04-26 Frequency: Daily Modality: individual Progress: 60%  Related Problem: Learn and implement coping skills that result in a reduction of anxiety and worry, and improved daily functioning. Description: Learn and implement problem-solving strategies for realistically addressing worries. Target Date: 2023-04-26 Frequency: Daily Modality: individual Progress: 80%  Related Problem: Learn and implement coping skills that result in a reduction of anxiety and worry, and improved daily functioning. Description: Complete a medical evaluation to assess for possible contribution of medical or substance-related conditions to the anxiety. Target Date: 2023-04-26 Frequency:  Daily Modality: individual Progress: 90%  Related Problem: Learn and implement coping skills that result in a reduction of anxiety and worry, and improved daily functioning. Description: Describe situations, thoughts, feelings, and actions associated with anxieties and worries, their impact on functioning, and attempts to resolve them. Target Date: 2023-04-26 Frequency: Daily Modality: individual Progress: 70%  Client Response full compliance  Service Location Location, [address] [place], [place] [address]  Service Code cpt [phone] P Identify automatic thoughts  Facilitate problem solving  Session Notes:  Dx.: Generalized Anxiety  Meds.: Xanax  Patient states that he would like to have a video Webex video session. He is at home and I am at my home office.  Goals: States that he is seeking therapy to reduce extreme anxiety and the associated cognitive and physical distress. Tends to have a variety of somatic complaints that he hopes to resolve. Will utilize FOO exploration and cognitive behavioral strategies. Target date is 5-22. Revised target is 12-24. Session notes: Arthur Ho says he took time off for the holidays and it went well for him and the family. He is unfortunately experiencing heart palpitations, which he feels is related to stress. There have been a lot of birthdays around the holidays and he has spent more money than he has coming in. He remains chronically worried and gets frustrated with the physical manifestations. He is also starting to stress about his son being 37 and having only one more year at home. His son is "doing better" than before, but Gray finds it overwhelming that he will be leaving home.  Talked about his FOO and the frustration and anger he has at those people in his family who gas light others and then "sitting around feeling sorry for themselves". He hates when people feel sorry for themselves. He also talked about being  scapegoated in his family. Parents  treated his sibs really well, while always being negative to him and rejecting him. He does harbor anger and frustration, but feels more capable of managing it. The exception is his periodic heart palpitations.                                                            Marcelina Morel, PhD Time: 10:40a-11:30p 50 min.

## 2022-05-21 ENCOUNTER — Other Ambulatory Visit: Payer: Self-pay | Admitting: Family Medicine

## 2022-05-21 DIAGNOSIS — F419 Anxiety disorder, unspecified: Secondary | ICD-10-CM

## 2022-05-21 MED ORDER — ALPRAZOLAM 0.25 MG PO TABS: 0.2500 mg | ORAL_TABLET | Freq: Every evening | ORAL | 0 refills | Status: DC | PRN

## 2022-05-21 NOTE — Telephone Encounter (Signed)
From: [name] To: Office of [place], [place] Sent: 05/21/2022 4:20 PM EST Subject: Medication Renewal Request  Refills have been requested for the following medications:   ALPRAZolam (XANAX) 0.25 MG tablet [Shane Tysinger]  Preferred pharmacy: [id number] - [place], [place] - [address] AT [address] Delivery method: [employer]

## 2022-06-09 DIAGNOSIS — G4733 Obstructive sleep apnea (adult) (pediatric): Secondary | ICD-10-CM | POA: Diagnosis not present

## 2022-06-15 ENCOUNTER — Ambulatory Visit (INDEPENDENT_AMBULATORY_CARE_PROVIDER_SITE_OTHER): Payer: Commercial Managed Care - PPO | Admitting: Psychology

## 2022-06-15 DIAGNOSIS — F411 Generalized anxiety disorder: Secondary | ICD-10-CM

## 2022-06-15 NOTE — Progress Notes (Signed)
06/15/2022  Treatment Plan: Diagnosis F41.1 (Generalized anxiety disorder) [n/a]  Symptoms Excessive and/or unrealistic worry that is difficult to control occurring more days than not for at least 6 months about a number of events or activities. (Status: maintained) -- No Description Entered  Hypervigilance (e.g., feeling constantly on edge, experiencing concentration difficulties, having trouble falling or staying asleep, exhibiting a general state of irritability). (Status: maintained) -- No Description Entered  Medication Status compliance  Safety none  If Suicidal or Homicidal State Action Taken: unspecified  Current Risk: low Medications xanax (Dosage: unknown)  Objectives Related Problem: Learn and implement coping skills that result in a reduction of anxiety and worry, and improved daily functioning. Description: Reestablish a consistent sleep-wake cycle. Target Date: 2023-04-26 Frequency: Daily Modality: individual Progress: 50%  Related Problem: Learn and implement coping skills that result in a reduction of anxiety and worry, and improved daily functioning. Description: Maintain involvement in work, family, and social activities. Target Date: 2023-04-26 Frequency: Daily Modality: individual Progress: 60%  Related Problem: Learn and implement coping skills that result in a reduction of anxiety and worry, and improved daily functioning. Description: Learn and implement problem-solving strategies for realistically addressing worries. Target Date: 2023-04-26 Frequency: Daily Modality: individual Progress: 80%  Related Problem: Learn and implement coping skills that result in a reduction of anxiety and worry, and improved daily functioning. Description: Complete a medical evaluation to assess for possible contribution of medical or substance-related conditions to the anxiety. Target  Date: 2023-04-26 Frequency: Daily Modality: individual Progress: 90%  Related Problem: Learn and implement coping skills that result in a reduction of anxiety and worry, and improved daily functioning. Description: Describe situations, thoughts, feelings, and actions associated with anxieties and worries, their impact on functioning, and attempts to resolve them. Target Date: 2023-04-26 Frequency: Daily Modality: individual Progress: 70%  Client Response full compliance  Service Location Location, [address] [place], [place] [address]  Service Code cpt [phone] P Identify automatic thoughts  Facilitate problem solving  Session Notes:  Dx.: Generalized Anxiety  Meds.: Xanax  Patient states that he would like to have a video Webex video session. He is at home and I am at my home office.  Goals: States that he is seeking therapy to reduce extreme anxiety and the associated cognitive and physical distress. Tends to have a variety of somatic complaints that he hopes to resolve. Will utilize FOO exploration and cognitive behavioral strategies. Target date is 5-22. Revised target is 12-24. Session notes: Arthur Ho says he has offloaded some of his stress and he isn't having heart palpitations. Biggest current stressor is his son's poor academic performance. We talked about the need to be clear with son about expectations and what he needs to do to get his car back. He has not needed to take medicine very ofter. Takes about half of a .70m Xanax a week. Moods are stable and he is getting along well at home (with exception of his frustration with son).  Marcelina Morel, PhD Time: 10:35a-11:20a 45 min.

## 2022-07-08 DIAGNOSIS — G4733 Obstructive sleep apnea (adult) (pediatric): Secondary | ICD-10-CM | POA: Diagnosis not present

## 2022-07-13 ENCOUNTER — Ambulatory Visit (INDEPENDENT_AMBULATORY_CARE_PROVIDER_SITE_OTHER): Payer: Commercial Managed Care - PPO | Admitting: Psychology

## 2022-07-13 DIAGNOSIS — F411 Generalized anxiety disorder: Secondary | ICD-10-CM | POA: Diagnosis not present

## 2022-07-13 NOTE — Progress Notes (Signed)
07/13/2022  Treatment Plan: Diagnosis F41.1 (Generalized anxiety disorder) [n/a]  Symptoms Excessive and/or unrealistic worry that is difficult to control occurring more days than not for at least 6 months about a number of events or activities. (Status: maintained) -- No Description Entered  Hypervigilance (e.g., feeling constantly on edge, experiencing concentration difficulties, having trouble falling or staying asleep, exhibiting a general state of irritability). (Status: maintained) -- No Description Entered  Medication Status compliance  Safety none  If Suicidal or Homicidal State Action Taken: unspecified  Current Risk: low Medications xanax (Dosage: unknown)  Objectives Related Problem: Learn and implement coping skills that result in a reduction of anxiety and worry, and improved daily functioning. Description: Reestablish a consistent sleep-wake cycle. Target Date: 2023-04-27 Frequency: Daily Modality: individual Progress: 80%  Related Problem: Learn and implement coping skills that result in a reduction of anxiety and worry, and improved daily functioning. Description: Maintain involvement in work, family, and social activities. Target Date: 2023-04-27 Frequency: Daily Modality: individual Progress: 80%  Related Problem: Learn and implement coping skills that result in a reduction of anxiety and worry, and improved daily functioning. Description: Learn and implement problem-solving strategies for realistically addressing worries. Target Date: 2023-04-27 Frequency: Daily Modality: individual Progress: 80%  Related Problem: Learn and implement coping skills that result in a reduction of anxiety and worry, and improved daily functioning. Description: Complete a medical evaluation to assess for possible contribution of medical or substance-related conditions to the anxiety. Target Date: 2023-04-27 Frequency: Daily Modality: individual Progress: 95%  Related  Problem: Learn and implement coping skills that result in a reduction of anxiety and worry, and improved daily functioning. Description: Describe situations, thoughts, feelings, and actions associated with anxieties and worries, their impact on functioning, and attempts to resolve them. Target Date: 2023-04-27 Frequency: Daily Modality: individual Progress: 80%  Client Response full compliance  Service Location Location, [address] [place], [place] [address]  Service Code cpt [phone] P Identify automatic thoughts  Facilitate problem solving  Session Notes:  Dx.: Generalized Anxiety  Meds.: Xanax  Patient states that he would like to have a video Webex video session. He is at home and I am at my home office.  Goals: States that he is seeking therapy to reduce extreme anxiety and the associated cognitive and physical distress. Tends to have a variety of somatic complaints that he hopes to resolve. Will utilize FOO exploration and cognitive behavioral strategies. Target date is 5-22. Revised target is 12-24. Session notes: Arthur Ho says work is very busy. He is very pleased with his progress. His biggest concern is that his son is still having school problems. His son's girlfriend broke up with him on Friday. He is handling it very well and he is hoping it will have a positive impact on his grades. He states that his physical symptoms of anxiety have been much better recently. He has not needed to take his Xanax because he is much calmer than before. He says he no longer has "anticipatory anxiety". He has a strong tendency to "miss the journey" because he is so focused on the destination.  Marcelina Morel, PhD Time: 10:40a-11:30a 50 min.

## 2022-08-08 DIAGNOSIS — G4733 Obstructive sleep apnea (adult) (pediatric): Secondary | ICD-10-CM | POA: Diagnosis not present

## 2022-08-24 ENCOUNTER — Ambulatory Visit (INDEPENDENT_AMBULATORY_CARE_PROVIDER_SITE_OTHER): Payer: Commercial Managed Care - PPO | Admitting: Psychology

## 2022-08-24 DIAGNOSIS — F411 Generalized anxiety disorder: Secondary | ICD-10-CM

## 2022-08-24 NOTE — Progress Notes (Signed)
08/24/2022  Treatment Plan: Diagnosis F41.1 (Generalized anxiety disorder) [n/a]  Symptoms Excessive and/or unrealistic worry that is difficult to control occurring more days than not for at least 6 months about a number of events or activities. (Status: maintained) -- No Description Entered  Hypervigilance (e.g., feeling constantly on edge, experiencing concentration difficulties, having trouble falling or staying asleep, exhibiting a general state of irritability). (Status: maintained) -- No Description Entered  Medication Status compliance  Safety none  If Suicidal or Homicidal State Action Taken: unspecified  Current Risk: low Medications xanax (Dosage: unknown)  Objectives Related Problem: Learn and implement coping skills that result in a reduction of anxiety and worry, and improved daily functioning. Description: Reestablish a consistent sleep-wake cycle. Target Date: 2023-04-27 Frequency: Daily Modality: individual Progress: 80%  Related Problem: Learn and implement coping skills that result in a reduction of anxiety and worry, and improved daily functioning. Description: Maintain involvement in work, family, and social activities. Target Date: 2023-04-27 Frequency: Daily Modality: individual Progress: 80%  Related Problem: Learn and implement coping skills that result in a reduction of anxiety and worry, and improved daily functioning. Description: Learn and implement problem-solving strategies for realistically addressing worries. Target Date: 2023-04-27 Frequency: Daily Modality: individual Progress: 80%  Related Problem: Learn and implement coping skills that result in a reduction of anxiety and worry, and improved daily functioning. Description: Complete a medical evaluation to assess for possible contribution of medical or substance-related conditions to the anxiety. Target Date: 2023-04-27 Frequency: Daily Modality:  individual Progress: 95%  Related Problem: Learn and implement coping skills that result in a reduction of anxiety and worry, and improved daily functioning. Description: Describe situations, thoughts, feelings, and actions associated with anxieties and worries, their impact on functioning, and attempts to resolve them. Target Date: 2023-04-27 Frequency: Daily Modality: individual Progress: 80%  Client Response full compliance  Service Location Location, [address] [place], [place] [address]  Service Code cpt [phone] P Identify automatic thoughts  Facilitate problem solving  Session Notes:  Dx.: Generalized Anxiety  Meds.: Xanax  Patient states that he would like to have a video Webex video session. He is at home and I am at my home office.  Goals: States that he is seeking therapy to reduce extreme anxiety and the associated cognitive and physical distress. Tends to have a variety of somatic complaints that he hopes to resolve. Will utilize FOO exploration and cognitive behavioral strategies. Target date is 5-22. Revised target is 12-24. 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 His aunt and former partner is selling her house and moving to [place]. He is thrilled she is leaving. Says his brother and his family are coming to visit for a couple of says. His brother has visited him in [place] only once and Finnean is annoyed with him. He says that he gets along okay with his FOO, but they really annoy him. Says he is very satisfied with his family and says that overall, they are far healthier than his FOO. He is working on his wife's "she-shed". Feels very satisfied. Moods and anxiety remains stable.  Garrel Ridgel, PhD Time: 10:40a-11:30a 50 min.

## 2022-09-01 ENCOUNTER — Other Ambulatory Visit: Payer: Self-pay | Admitting: Family Medicine

## 2022-09-01 DIAGNOSIS — F419 Anxiety disorder, unspecified: Secondary | ICD-10-CM

## 2022-09-02 MED ORDER — ALPRAZOLAM 0.25 MG PO TABS: 0.2500 mg | ORAL_TABLET | Freq: Every evening | ORAL | 0 refills | Status: DC | PRN

## 2022-09-02 NOTE — Telephone Encounter (Signed)
Is it ok to refill? 

## 2022-09-10 DIAGNOSIS — G4733 Obstructive sleep apnea (adult) (pediatric): Secondary | ICD-10-CM | POA: Diagnosis not present

## 2022-09-21 ENCOUNTER — Ambulatory Visit (INDEPENDENT_AMBULATORY_CARE_PROVIDER_SITE_OTHER): Payer: Commercial Managed Care - PPO | Admitting: Psychology

## 2022-09-21 DIAGNOSIS — F411 Generalized anxiety disorder: Secondary | ICD-10-CM | POA: Diagnosis not present

## 2022-09-21 NOTE — Progress Notes (Signed)
09/21/2022  Treatment Plan: Diagnosis F41.1 (Generalized anxiety disorder) [n/a]  Symptoms Excessive and/or unrealistic worry that is difficult to control occurring more days than not for at least 6 months about a number of events or activities. (Status: maintained) -- No Description Entered  Hypervigilance (e.g., feeling constantly on edge, experiencing concentration difficulties, having trouble falling or staying asleep, exhibiting a general state of irritability). (Status: maintained) -- No Description Entered  Medication Status compliance  Safety none  If Suicidal or Homicidal State Action Taken: unspecified  Current Risk: low Medications xanax (Dosage: unknown)  Objectives Related Problem: Learn and implement coping skills that result in a reduction of anxiety and worry, and improved daily functioning. Description: Reestablish a consistent sleep-wake cycle. Target Date: 2023-04-27 Frequency: Daily Modality: individual Progress: 80%  Related Problem: Learn and implement coping skills that result in a reduction of anxiety and worry, and improved daily functioning. Description: Maintain involvement in work, family, and social activities. Target Date: 2023-04-27 Frequency: Daily Modality: individual Progress: 80%  Related Problem: Learn and implement coping skills that result in a reduction of anxiety and worry, and improved daily functioning. Description: Learn and implement problem-solving strategies for realistically addressing worries. Target Date: 2023-04-27 Frequency: Daily Modality: individual Progress: 80%  Related Problem: Learn and implement coping skills that result in a reduction of anxiety and worry, and improved daily functioning. Description: Complete a medical evaluation to assess for possible contribution of medical or substance-related conditions to the anxiety. Target Date:  2023-04-27 Frequency: Daily Modality: individual Progress: 95%  Related Problem: Learn and implement coping skills that result in a reduction of anxiety and worry, and improved daily functioning. Description: Describe situations, thoughts, feelings, and actions associated with anxieties and worries, their impact on functioning, and attempts to resolve them. Target Date: 2023-04-27 Frequency: Daily Modality: individual Progress: 80%  Client Response full compliance  Service Location Location, [address] [place], [place] [address]  Service Code cpt [phone] P Identify automatic thoughts  Facilitate problem solving  Session Notes:  Dx.: Generalized Anxiety  Meds.: Xanax  Patient states that he would like to have a video Caregility video session. He is at home and I am at my home office.  Goals: States that he is seeking therapy to reduce extreme anxiety and the associated cognitive and physical distress. Tends to have a variety of somatic complaints that he hopes to resolve. Will utilize FOO exploration and cognitive behavioral strategies. Target date is 5-22. Revised target is 12-24. 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 Her aunt has already moved away and he has a feeling that she is angry at him. He is bothered by her attitude (giving him the "cold shoulder"), but not enough to confront her. He feels she has let him down and she is not following through with her promises. He feels that nobody supports his success and looks down on him.  Says that things at home are good, but he is so busy he hardly has time for himself. Not sure what the kids are doing for summer. Son is still struggling at school and is at a loss with regard to how to help. His daughter is "complete  opposite". She is a Equities trader and Arthur Ho is very proud of what she is  accomplishing. He feels that his self-talk has been helpful to manage his anxiety.  Brother's visit is this Saturday. He will be here with his family, but only for a day.                                                                         Garrel Ridgel, PhD Time: 10:40a-11:30a 50 min.

## 2022-10-11 DIAGNOSIS — G4733 Obstructive sleep apnea (adult) (pediatric): Secondary | ICD-10-CM | POA: Diagnosis not present

## 2022-10-19 ENCOUNTER — Encounter: Payer: 59 | Admitting: Family Medicine

## 2022-10-21 ENCOUNTER — Encounter: Payer: Self-pay | Admitting: Family Medicine

## 2022-10-21 ENCOUNTER — Ambulatory Visit (INDEPENDENT_AMBULATORY_CARE_PROVIDER_SITE_OTHER): Payer: Commercial Managed Care - PPO | Admitting: Family Medicine

## 2022-10-21 VITALS — BP 150/84 | HR 86 | Temp 98.1°F | Ht 69.0 in | Wt 210.2 lb

## 2022-10-21 DIAGNOSIS — F411 Generalized anxiety disorder: Secondary | ICD-10-CM | POA: Diagnosis not present

## 2022-10-21 DIAGNOSIS — Z87442 Personal history of urinary calculi: Secondary | ICD-10-CM

## 2022-10-21 DIAGNOSIS — Z1211 Encounter for screening for malignant neoplasm of colon: Secondary | ICD-10-CM | POA: Diagnosis not present

## 2022-10-21 DIAGNOSIS — F419 Anxiety disorder, unspecified: Secondary | ICD-10-CM

## 2022-10-21 DIAGNOSIS — Z0000 Encounter for general adult medical examination without abnormal findings: Secondary | ICD-10-CM | POA: Diagnosis not present

## 2022-10-21 DIAGNOSIS — E782 Mixed hyperlipidemia: Secondary | ICD-10-CM | POA: Diagnosis not present

## 2022-10-21 DIAGNOSIS — G4733 Obstructive sleep apnea (adult) (pediatric): Secondary | ICD-10-CM | POA: Diagnosis not present

## 2022-10-21 DIAGNOSIS — R002 Palpitations: Secondary | ICD-10-CM

## 2022-10-21 DIAGNOSIS — K219 Gastro-esophageal reflux disease without esophagitis: Secondary | ICD-10-CM | POA: Diagnosis not present

## 2022-10-21 MED ORDER — ATORVASTATIN CALCIUM 40 MG PO TABS: 40.0000 mg | ORAL_TABLET | Freq: Every day | ORAL | 3 refills | Status: DC

## 2022-10-21 NOTE — Progress Notes (Signed)
Complete physical exam  Patient: Arthur Ho   DOB: 05/09/1975   47 y.o. Male  MRN: [id number]  Subjective:    Chief Complaint  Patient presents with   Annual Exam    Arthur Ho is a 47 y.o. male who presents today for a complete physical exam. He reports consuming a general diet. Home exercise routine includes walking 3-4 times a week. He generally feels fairly well. He reports sleeping fairly well. He does not have additional problems to discuss today.  He has been involved in counseling for his GAD and is made good progress there.  He does have occasional PVCs and does use Xanax on a rare occurrence when that occurs.  He rarely uses Xanax.  His reflux rarely causes trouble and he usually handles quite well.  He does have OSA and uses a CPAP and is very happy with the results from that.  He also has a history of hyperlipidemia and kidney stones.  His work and home life are going quite well.   Most recent fall risk assessment:    10/21/2022    1:51 PM  Fall Risk   Falls in the past year? 0  Number falls in past yr: 0  Injury with Fall? 0  Risk for fall due to : No Fall Risks  Follow up Falls evaluation completed     Most recent depression screenings:    10/21/2022    1:52 PM 10/06/2021   10:50 AM  PHQ 2/9 Scores  PHQ - 2 Score 0 0    Vision:Within last year and Dental: Receives regular dental care    Patient Care Team: Ronnald Nian, MD as PCP - General (Family Medicine) Runell Gess, MD as PCP - Cardiology (Cardiology)   Outpatient Medications Prior to Visit  Medication Sig Note   ALPRAZolam (XANAX) 0.25 MG tablet Take 1 tablet (0.25 mg total) by mouth at bedtime as needed for anxiety.    atorvastatin (LIPITOR) 40 MG tablet Take 1 tablet (40 mg total) by mouth daily. (Patient not taking: Reported on 10/21/2022) 10/21/2022: Did not start medictaion   [DISCONTINUED] chlorhexidine (HIBICLENS) 4 % external liquid Apply topically daily as needed.     [DISCONTINUED] doxycycline (VIBRA-TABS) 100 MG tablet Take 1 tablet (100 mg total) by mouth 2 (two) times daily.    [DISCONTINUED] mupirocin ointment (BACTROBAN) 2 % Apply 1 Application topically 2 (two) times daily.    No facility-administered medications prior to visit.    Review of Systems  All other systems reviewed and are negative.         Objective:     BP (!) 150/84 (BP Location: Right Arm, Cuff Size: Large)   Pulse 86   Temp 98.1 F (36.7 C) (Oral)   Ht 5\' 9"  (1.753 m)   Wt 210 lb 3.2 oz (95.3 kg)   SpO2 98% Comment: room air  BMI 31.04 kg/m    Physical Exam  Alert and in no distress. Tympanic membranes and canals are normal. Pharyngeal area is normal. Neck is supple without adenopathy or thyromegaly. Cardiac exam shows a regular sinus rhythm without murmurs or gallops. Lungs are clear to auscultation.      Assessment & Plan:    Routine general medical examination at a health care facility - Plan: CBC with Differential/Platelet, Comprehensive metabolic panel, Lipid panel  Obstructive sleep apnea  Gastroesophageal reflux disease, unspecified whether esophagitis present  Palpitations  Mixed hyperlipidemia - Plan: atorvastatin (LIPITOR) 40 MG tablet  GAD (generalized anxiety disorder)  Screening for colon cancer  Screening for cervical cancer - Plan: Cologuard  History of kidney stones  Immunization History  Administered Date(s) Administered   Influenza-Unspecified 03/26/2021   Tdap 02/15/2017    Health Maintenance  Topic Date Due   COVID-19 Vaccine (1) Never done   HIV Screening  Never done   Colonoscopy  Never done   INFLUENZA VACCINE  12/03/2022   DTaP/Tdap/Td (2 - Td or Tdap) 02/16/2027   Hepatitis C Screening  Completed   HPV VACCINES  Aged Out    Discussed continuing in counseling and using the Xanax on an as-needed basis.  If he has reflux symptoms he can certainly use Prilosec on an as-needed basis.  He will continue on his  CPAP. Problem List Items Addressed This Visit       Respiratory   Obstructive sleep apnea     Digestive   GERD (gastroesophageal reflux disease)     Other   Anxiety   History of kidney stones   Hyperlipidemia   Palpitations   Other Visit Diagnoses     Routine general medical examination at a health care facility    -  Primary      No follow-ups on file.     Benjiman Core, CMA

## 2022-10-22 ENCOUNTER — Encounter: Payer: Self-pay | Admitting: Family Medicine

## 2022-10-22 ENCOUNTER — Other Ambulatory Visit: Payer: Self-pay

## 2022-10-22 DIAGNOSIS — E782 Mixed hyperlipidemia: Secondary | ICD-10-CM

## 2022-10-22 LAB — LIPID PANEL
Chol/HDL Ratio: 5.1 ratio — ABNORMAL HIGH (ref 0.0–5.0)
Cholesterol, Total: 278 mg/dL — ABNORMAL HIGH (ref 100–199)
HDL: 54 mg/dL (ref >39)
LDL Chol Calc (NIH): 191 mg/dL — ABNORMAL HIGH (ref 0–99)
Triglycerides: 175 mg/dL — ABNORMAL HIGH (ref 0–149)
VLDL Cholesterol Cal: 33 mg/dL (ref 5–40)

## 2022-10-22 LAB — CBC WITH DIFFERENTIAL/PLATELET
Basophils Absolute: 0.1 10*3/uL (ref 0.0–0.2)
Basos: 1 %
EOS (ABSOLUTE): 0.1 10*3/uL (ref 0.0–0.4)
Eos: 1 %
Hematocrit: 47.8 % (ref 37.5–51.0)
Hemoglobin: 16.1 g/dL (ref 13.0–17.7)
Immature Grans (Abs): 0 10*3/uL (ref 0.0–0.1)
Immature Granulocytes: 0 %
Lymphocytes Absolute: 1.9 10*3/uL (ref 0.7–3.1)
Lymphs: 21 %
MCH: 31.4 pg (ref 26.6–33.0)
MCHC: 33.7 g/dL (ref 31.5–35.7)
MCV: 93 fL (ref 79–97)
Monocytes Absolute: 0.6 10*3/uL (ref 0.1–0.9)
Monocytes: 7 %
Neutrophils Absolute: 6.4 10*3/uL (ref 1.4–7.0)
Neutrophils: 70 %
Platelets: 335 10*3/uL (ref 150–450)
RBC: 5.13 x10E6/uL (ref 4.14–5.80)
RDW: 11.8 % (ref 11.6–15.4)
WBC: 9.1 10*3/uL (ref 3.4–10.8)

## 2022-10-22 LAB — COMPREHENSIVE METABOLIC PANEL
ALT: 32 IU/L (ref 0–44)
AST: 22 IU/L (ref 0–40)
Albumin: 4.8 g/dL (ref 4.1–5.1)
Alkaline Phosphatase: 64 IU/L (ref 44–121)
BUN/Creatinine Ratio: 10 (ref 9–20)
BUN: 11 mg/dL (ref 6–24)
Bilirubin Total: 0.4 mg/dL (ref 0.0–1.2)
CO2: 24 mmol/L (ref 20–29)
Calcium: 9.7 mg/dL (ref 8.7–10.2)
Chloride: 102 mmol/L (ref 96–106)
Creatinine, Ser: 1.06 mg/dL (ref 0.76–1.27)
Globulin, Total: 2.8 g/dL (ref 1.5–4.5)
Glucose: 95 mg/dL (ref 70–99)
Potassium: 4.5 mmol/L (ref 3.5–5.2)
Sodium: 139 mmol/L (ref 134–144)
Total Protein: 7.6 g/dL (ref 6.0–8.5)
eGFR: 88 mL/min/{1.73_m2} (ref >59)

## 2022-10-22 MED ORDER — ROSUVASTATIN CALCIUM 40 MG PO TABS
40.0000 mg | ORAL_TABLET | Freq: Every day | ORAL | 3 refills | Status: DC
Start: 2022-10-22 — End: 2023-10-27

## 2022-10-22 NOTE — Addendum Note (Signed)
Addended by: Ronnald Nian on: 10/22/2022 01:23 PM   Modules accepted: Orders

## 2022-10-26 ENCOUNTER — Ambulatory Visit (INDEPENDENT_AMBULATORY_CARE_PROVIDER_SITE_OTHER): Payer: Commercial Managed Care - PPO | Admitting: Psychology

## 2022-10-26 DIAGNOSIS — F411 Generalized anxiety disorder: Secondary | ICD-10-CM

## 2022-10-26 NOTE — Progress Notes (Signed)
10/26/2022  Treatment Plan: Diagnosis F41.1 (Generalized anxiety disorder) [n/a]  Symptoms Excessive and/or unrealistic worry that is difficult to control occurring more days than not for at least 6 months about a number of events or activities. (Status: maintained) -- No Description Entered  Hypervigilance (e.g., feeling constantly on edge, experiencing concentration difficulties, having trouble falling or staying asleep, exhibiting a general state of irritability). (Status: maintained) -- No Description Entered  Medication Status compliance  Safety none  If Suicidal or Homicidal State Action Taken: unspecified  Current Risk: low Medications xanax (Dosage: unknown)  Objectives Related Problem: Learn and implement coping skills that result in a reduction of anxiety and worry, and improved daily functioning. Description: Reestablish a consistent sleep-wake cycle. Target Date: 2023-04-27 Frequency: Daily Modality: individual Progress: 80%  Related Problem: Learn and implement coping skills that result in a reduction of anxiety and worry, and improved daily functioning. Description: Maintain involvement in work, family, and social activities. Target Date: 2023-04-27 Frequency: Daily Modality: individual Progress: 80%  Related Problem: Learn and implement coping skills that result in a reduction of anxiety and worry, and improved daily functioning. Description: Learn and implement problem-solving strategies for realistically addressing worries. Target Date: 2023-04-27 Frequency: Daily Modality: individual Progress: 80%  Related Problem: Learn and implement coping skills that result in a reduction of anxiety and worry, and improved daily functioning. Description: Complete a medical evaluation to assess for possible contribution of medical or substance-related conditions to the  anxiety. Target Date: 2023-04-27 Frequency: Daily Modality: individual Progress: 95%  Related Problem: Learn and implement coping skills that result in a reduction of anxiety and worry, and improved daily functioning. Description: Describe situations, thoughts, feelings, and actions associated with anxieties and worries, their impact on functioning, and attempts to resolve them. Target Date: 2023-04-27 Frequency: Daily Modality: individual Progress: 80%  Client Response full compliance  Service Location Location, [address] [place], [place] [address]  Service Code cpt [phone] P Identify automatic thoughts  Facilitate problem solving  Session Notes:  Dx.: Generalized Anxiety  Meds.: Xanax  Patient states that he would like to have a video Caregility video session. He is at home and I am at my home office.  Goals: States that he is seeking therapy to reduce extreme anxiety and the associated cognitive and physical distress. Tends to have a variety of somatic complaints that he hopes to resolve. Will utilize FOO exploration and cognitive behavioral strategies. Target date is 5-22. Revised target is 12-24. Session notes: Arthur Ho got a blood test and his cholesterol is still high. He is going on a statin drug to bring it down. He is also changing his dietary habits He has not been exercising and hopes to get back to it. He is going to start walking again. 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 They will have their 20th anniversary later in week.  Arthur Ridgel, PhD Time: 11:40a-12:30a 50 min.

## 2022-11-10 DIAGNOSIS — G4733 Obstructive sleep apnea (adult) (pediatric): Secondary | ICD-10-CM | POA: Diagnosis not present

## 2022-12-04 ENCOUNTER — Ambulatory Visit: Payer: Commercial Managed Care - PPO | Admitting: Medical

## 2022-12-04 ENCOUNTER — Encounter: Payer: Self-pay | Admitting: Medical

## 2022-12-04 VITALS — BP 132/84 | HR 106 | Wt 197.4 lb

## 2022-12-04 DIAGNOSIS — K2900 Acute gastritis without bleeding: Secondary | ICD-10-CM

## 2022-12-04 DIAGNOSIS — Z1211 Encounter for screening for malignant neoplasm of colon: Secondary | ICD-10-CM | POA: Diagnosis not present

## 2022-12-04 DIAGNOSIS — R5381 Other malaise: Secondary | ICD-10-CM | POA: Diagnosis not present

## 2022-12-04 DIAGNOSIS — R195 Other fecal abnormalities: Secondary | ICD-10-CM

## 2022-12-04 MED ORDER — OMEPRAZOLE 40 MG PO CPDR: 40.0000 mg | DELAYED_RELEASE_CAPSULE | Freq: Every day | ORAL | 0 refills | Status: DC

## 2022-12-04 NOTE — Progress Notes (Signed)
Subjective:  Arthur Ho is a 47 y.o. male who presents for Chief Complaint  Patient presents with   other    Felt like e had something stuck in his throat, felt like acid reflux, had food poisoning Friday, had chills Monday night and had a temp 101     Here for gastric concerns.   Was on vacation the week before last.  Over the weekend ending the vacation he thinks he got food poisoning had 24 hours of loose stools and not feeling well.  But then over that weekend which was 6 to 7 days ago started feeling better.  However he still felt a slight fever 5 days ago, has felt burning in the back of the mouth, felt like food got stuck 1 time.  He has a history of acid reflux.  He has been taking some Pepcid a few times this week.  Does not feel overall great so wanted come in to get checked out.  No URI symptoms.  He notes earlier in the week he has eaten some unhealthy foods including biscuits and gravy, fresh squeeze orange juice, [place] food, and thinks he just has irritated his stomach with the different spicy foods.  He was worried about H. pylori or other issues though.  He also drank some alcohol earlier in the week.  He has not done colon cancer screening yet but does have the Cologuard kit at home.  Currently no belly pain or back pain, no urinary complaint, no body aches or chills or fever.  No other aggravating or relieving factors.    No other c/o.  Past Medical History:  Diagnosis Date   Calcium nephrolithiasis    GERD (gastroesophageal reflux disease)    Current Outpatient Medications on File Prior to Visit  Medication Sig Dispense Refill   ALPRAZolam (XANAX) 0.25 MG tablet Take 1 tablet (0.25 mg total) by mouth at bedtime as needed for anxiety. (Patient not taking: Reported on 12/04/2022) 30 tablet 0   rosuvastatin (CRESTOR) 40 MG tablet Take 1 tablet (40 mg total) by mouth daily. (Patient not taking: Reported on 12/04/2022) 90 tablet 3   No current facility-administered  medications on file prior to visit.     The following portions of the patient's history were reviewed and updated as appropriate: allergies, current medications, past family history, past medical history, past social history, past surgical history and problem list.  ROS Otherwise as in subjective above  Objective: BP 132/84   Pulse (!) 106   Wt 197 lb 6.4 oz (89.5 kg)   BMI 29.15 kg/m   General appearance: alert, no distress, well developed, well nourished HEENT: normocephalic, sclerae anicteric,pharynx normal Oral cavity: Somewhat dry MM, no lesions Neck: supple, no lymphadenopathy, no thyromegaly, no masses Heart: RRR, normal S1, S2, no murmurs Lungs: CTA bilaterally, no wheezes, rhonchi, or rales Abdomen: +bs, soft, non tender, non distended, no masses, no hepatomegaly, no splenomegaly Pulses: 2+ radial pulses, 2+ pedal pulses, normal cap refill Ext: no edema   Assessment: Encounter Diagnoses  Name Primary?   Acute gastritis without hemorrhage, unspecified gastritis type Yes   Malaise    Loose stools    Screen for colon cancer      Plan: I suspect your symptoms are related to the variety of different things you have eaten in the past week including possible food poisoning over a week ago.  Begin omeprazole 40 mg 1 tablet about 30 to 45 minutes before breakfast.  Take this for the  least the next 2 weeks to help with stomach upset and excess stomach acid reflux  Avoid foods that make reflux worse for the time being.  Avoid spicy foods, acidic foods, peppers, [place] food, [place] food, [place] food, large portions and do not rush through meals.  Avoid alcohol for the next few days if possible.  Consider sleeping with the head of the bed elevated the next few days  Avoid eating and then going straight to bed and lying down.  Try to not eat or drink within an hour and half to 2 hours of bedtime  You can also take Pepcid in the evening if needed.   I do recommend in  addition to omeprazole that you take Pepto-Bismol over-the-counter twice daily for the next 3 to 5 days  You are a little dehydrated.  I do recommend increasing your fluid intake over the next few days particularly water and some electrolytes such as G2 Gatorade or Pedialyte over-the-counter.  If much worse in the next few days or new symptoms then get reevaluated.   Turn in your Cologuard colon cancer kit soon  If not a lot worse but if no significant improvement over the next 2 weeks then follow-up with Dr. Susann Givens  Sometimes the symptoms persist, people end up having an endoscopy to look for ulcers or H. pylori    Arthur Ho was seen today for other.  Diagnoses and all orders for this visit:  Acute gastritis without hemorrhage, unspecified gastritis type  Malaise  Loose stools  Screen for colon cancer  Other orders -     omeprazole (PRILOSEC) 40 MG capsule; Take 1 capsule (40 mg total) by mouth daily.    Follow up: prn

## 2022-12-04 NOTE — Patient Instructions (Signed)
I suspect your symptoms are related to the variety of different things you have eaten in the past week including possible food poisoning over a week ago.  Begin omeprazole 40 mg 1 tablet about 30 to 45 minutes before breakfast.  Take this for the least the next 2 weeks to help with stomach upset and excess stomach acid reflux  Avoid foods that make reflux worse for the time being.  Avoid spicy foods, acidic foods, peppers, [place] food, [place] food, [place] food, large portions and do not rush through meals.  Avoid alcohol for the next few days if possible.  Consider sleeping with the head of the bed elevated the next few days  Avoid eating and then going straight to bed and lying down.  Try to not eat or drink within an hour and half to 2 hours of bedtime  You can also take Pepcid in the evening if needed.   I do recommend in addition to omeprazole that you take Pepto-Bismol over-the-counter twice daily for the next 3 to 5 days  You are a little dehydrated.  I do recommend increasing your fluid intake over the next few days particularly water and some electrolytes such as G2 Gatorade or Pedialyte over-the-counter.  If much worse in the next few days or new symptoms then get reevaluated.   Turn in your Cologuard colon cancer kit soon  If not a lot worse but if no significant improvement over the next 2 weeks then follow-up with Dr. Susann Givens  Sometimes the symptoms persist, people end up having an endoscopy to look for ulcers or H. pylori

## 2022-12-09 DIAGNOSIS — G4733 Obstructive sleep apnea (adult) (pediatric): Secondary | ICD-10-CM | POA: Diagnosis not present

## 2022-12-11 ENCOUNTER — Other Ambulatory Visit: Payer: Self-pay

## 2022-12-11 ENCOUNTER — Encounter (HOSPITAL_BASED_OUTPATIENT_CLINIC_OR_DEPARTMENT_OTHER): Payer: Self-pay | Admitting: Emergency Medicine

## 2022-12-11 ENCOUNTER — Emergency Department (HOSPITAL_BASED_OUTPATIENT_CLINIC_OR_DEPARTMENT_OTHER): Payer: Commercial Managed Care - PPO

## 2022-12-11 ENCOUNTER — Emergency Department (HOSPITAL_BASED_OUTPATIENT_CLINIC_OR_DEPARTMENT_OTHER)
Admission: EM | Admit: 2022-12-11 | Discharge: 2022-12-11 | Disposition: A | Discharge status: Home / Self Care | Payer: Commercial Managed Care - PPO | Attending: Emergency Medicine | Admitting: Emergency Medicine

## 2022-12-11 DIAGNOSIS — N400 Enlarged prostate without lower urinary tract symptoms: Secondary | ICD-10-CM | POA: Diagnosis not present

## 2022-12-11 DIAGNOSIS — N2889 Other specified disorders of kidney and ureter: Secondary | ICD-10-CM | POA: Diagnosis not present

## 2022-12-11 DIAGNOSIS — E860 Dehydration: Secondary | ICD-10-CM

## 2022-12-11 DIAGNOSIS — K429 Umbilical hernia without obstruction or gangrene: Secondary | ICD-10-CM | POA: Diagnosis not present

## 2022-12-11 DIAGNOSIS — R109 Unspecified abdominal pain: Secondary | ICD-10-CM | POA: Diagnosis not present

## 2022-12-11 LAB — CBC
HCT: 45.5 % (ref 39.0–52.0)
Hemoglobin: 15.8 g/dL (ref 13.0–17.0)
MCH: 31.3 pg (ref 26.0–34.0)
MCHC: 34.7 g/dL (ref 30.0–36.0)
MCV: 90.1 fL (ref 80.0–100.0)
Platelets: 400 10*3/uL (ref 150–400)
RBC: 5.05 MIL/uL (ref 4.22–5.81)
RDW: 11.3 % — ABNORMAL LOW (ref 11.5–15.5)
WBC: 12.3 10*3/uL — ABNORMAL HIGH (ref 4.0–10.5)
nRBC: 0 % (ref 0.0–0.2)

## 2022-12-11 LAB — COMPREHENSIVE METABOLIC PANEL
ALT: 28 U/L (ref 0–44)
AST: 21 U/L (ref 15–41)
Albumin: 4.5 g/dL (ref 3.5–5.0)
Alkaline Phosphatase: 54 U/L (ref 38–126)
Anion gap: 11 (ref 5–15)
BUN: 10 mg/dL (ref 6–20)
CO2: 25 mmol/L (ref 22–32)
Calcium: 10 mg/dL (ref 8.9–10.3)
Chloride: 101 mmol/L (ref 98–111)
Creatinine, Ser: 1.09 mg/dL (ref 0.61–1.24)
GFR, Estimated: >60 mL/min (ref >60)
Glucose, Bld: 110 mg/dL — ABNORMAL HIGH (ref 70–99)
Potassium: 4.1 mmol/L (ref 3.5–5.1)
Sodium: 137 mmol/L (ref 135–145)
Total Bilirubin: 0.6 mg/dL (ref 0.3–1.2)
Total Protein: 8.1 g/dL (ref 6.5–8.1)

## 2022-12-11 LAB — URINALYSIS, ROUTINE W REFLEX MICROSCOPIC
Bacteria, UA: NONE SEEN
Bilirubin Urine: NEGATIVE
Glucose, UA: NEGATIVE mg/dL
Hgb urine dipstick: NEGATIVE
Ketones, ur: 15 mg/dL — AB
Nitrite: NEGATIVE
Protein, ur: NEGATIVE mg/dL
Specific Gravity, Urine: 1.006 (ref 1.005–1.030)
pH: 7 (ref 5.0–8.0)

## 2022-12-11 LAB — LIPASE, BLOOD: Lipase: 18 U/L (ref 11–51)

## 2022-12-11 MED ORDER — SODIUM CHLORIDE 0.9 % IV BOLUS
1000.0000 mL | Freq: Once | INTRAVENOUS | Status: AC
  Administered 2022-12-11: 1000 mL via INTRAVENOUS

## 2022-12-11 MED ORDER — KETOROLAC TROMETHAMINE 10 MG PO TABS: 10.0000 mg | ORAL_TABLET | Freq: Four times a day (QID) | ORAL | 0 refills | Status: DC | PRN

## 2022-12-11 MED ORDER — SODIUM CHLORIDE 0.9 % IV BOLUS: 1000.0000 mL | Freq: Once | INTRAVENOUS | Status: DC

## 2022-12-11 NOTE — ED Provider Notes (Signed)
[place] EMERGENCY DEPARTMENT AT [facility] Provider Note   CSN: [id number] Arrival date & time: 12/11/22  1213     History  Chief Complaint  Patient presents with   Abdominal Pain    Arthur Ho is a 47 y.o. male.   Abdominal Pain Patient was abdominal pain.  For around 2 weeks has been feeling bad.  2 weeks ago ate a bad [place] sandwich and shortly after developed nausea some vomiting.  Decreased appetite.  Previously had diarrhea.  Feels like he has a brain fog now.  States he is down about 20 pounds overall.  At times will get left flank pain.  States he has passed a kidney stone recently.  Had fevers initially but not now.  He is worried that he got some of the bad [place] meat.  There has recently been a recall for Listeria.     Home Medications Prior to Admission medications   Medication Sig Start Date End Date Taking? Authorizing Provider  ketorolac (TORADOL) 10 MG tablet Take 1 tablet (10 mg total) by mouth every 6 (six) hours as needed. 12/11/22  Yes Benjiman Core, MD  ALPRAZolam Prudy Feeler) 0.25 MG tablet Take 1 tablet (0.25 mg total) by mouth at bedtime as needed for anxiety. Patient not taking: Reported on 12/04/2022 09/02/22   Ronnald Nian, MD  omeprazole (PRILOSEC) 40 MG capsule Take 1 capsule (40 mg total) by mouth daily. 12/04/22   Tysinger, Kermit Balo, PA-C  rosuvastatin (CRESTOR) 40 MG tablet Take 1 tablet (40 mg total) by mouth daily. Patient not taking: Reported on 12/04/2022 10/22/22   Ronnald Nian, MD      Allergies    Contrast media [iodinated contrast media], Penicillins, and Sulfa antibiotics    Review of Systems   Review of Systems  Gastrointestinal:  Positive for abdominal pain.    Physical Exam Updated Vital Signs BP 136/84   Pulse 95   Temp 98.9 F (37.2 C) (Oral)   Resp 18   Ht 5\' 9"  (1.753 m)   Wt 87.1 kg   SpO2 96%   BMI 28.35 kg/m  Physical Exam Vitals and nursing note reviewed.  Cardiovascular:     Rate and Rhythm:  Normal rate and regular rhythm.  Pulmonary:     Breath sounds: Normal breath sounds.  Abdominal:     Tenderness: There is no abdominal tenderness.     Hernia: No hernia is present.  Skin:    General: Skin is warm.     Capillary Refill: Capillary refill takes less than 2 seconds.  Neurological:     Mental Status: He is alert.     ED Results / Procedures / Treatments   Labs (all labs ordered are listed, but only abnormal results are displayed) Labs Reviewed  COMPREHENSIVE METABOLIC PANEL - Abnormal; Notable for the following components:      Result Value   Glucose, Bld 110 (*)    All other components within normal limits  CBC - Abnormal; Notable for the following components:   WBC 12.3 (*)    RDW 11.3 (*)    All other components within normal limits  URINALYSIS, ROUTINE W REFLEX MICROSCOPIC - Abnormal; Notable for the following components:   Color, Urine COLORLESS (*)    Ketones, ur 15 (*)    Leukocytes,Ua TRACE (*)    All other components within normal limits  LIPASE, BLOOD    EKG None  Radiology CT Renal Stone Study  Result Date: 12/11/2022  CLINICAL DATA:  Acute flank pain. EXAM: CT ABDOMEN AND PELVIS WITHOUT CONTRAST TECHNIQUE: Multidetector CT imaging of the abdomen and pelvis was performed following the standard protocol without IV contrast. RADIATION DOSE REDUCTION: This exam was performed according to the departmental dose-optimization program which includes automated exposure control, adjustment of the mA and/or kV according to patient size and/or use of iterative reconstruction technique. COMPARISON:  April 01, 2012. FINDINGS: Lower chest: No acute abnormality. Hepatobiliary: No focal liver abnormality is seen. No gallstones, gallbladder wall thickening, or biliary dilatation. Pancreas: Unremarkable. No pancreatic ductal dilatation or surrounding inflammatory changes. Spleen: Normal in size without focal abnormality. Adrenals/Urinary Tract: Adrenal glands appear  normal. Bilateral nonobstructive nephrolithiasis is noted. No hydronephrosis or renal obstruction is noted. Urinary bladder is decompressed. 1.6 cm exophytic solid abnormality is seen arising from upper pole of right kidney. Stomach/Bowel: Stomach is within normal limits. Appendix appears normal. No evidence of bowel wall thickening, distention, or inflammatory changes. Vascular/Lymphatic: No significant vascular findings are present. No enlarged abdominal or pelvic lymph nodes. Reproductive: Mild prostatic enlargement is noted. Other: Small fat containing periumbilical hernia. No ascites is noted. Musculoskeletal: No acute or significant osseous findings. IMPRESSION: Bilateral nonobstructive nephrolithiasis. 1.6 cm exophytic solid-appearing abnormality is seen arising from upper pole of right kidney. Further evaluation with renal ultrasound or MRI is recommended to rule out possible renal neoplasm or malignancy. Mild prostatic enlargement. Small fat containing periumbilical hernia. Electronically Signed   By: Lupita Raider M.D.   On: 12/11/2022 15:30    Procedures Procedures    Medications Ordered in ED Medications  sodium chloride 0.9 % bolus 1,000 mL (0 mLs Intravenous Stopped 12/11/22 1404)  sodium chloride 0.9 % bolus 1,000 mL (0 mLs Intravenous Stopped 12/11/22 1521)    ED Course/ Medical Decision Making/ A&P                                 Medical Decision Making Amount and/or Complexity of Data Reviewed Labs: ordered. Radiology: ordered.  Risk Prescription drug management.   Patient with GI complaints after eating potentially bad sandwich.  However this occurred about 2 weeks ago.  Has been feeling bad since with decreased oral intake.  Also potentially passed a kidney stone.  Blood work overall reassuring although some dehydration.  Fluid bolus given.  Initial liter did not make patient feel much better but second 1 given.  With amount of pain that he has had on the left side will get  CT scan, particularly with recent kidney stone.  CT scan reassuring from a GI standpoint.  However does have renal stones which she has had previously.  Requested Toradol pills for if he gets a flare.  Was given a prescription for 6 pills.  However does have potential renal mass on the right side.  Not present on previous CT scan although that was 13 years ago.  Will have follow-up with urology.  Will need further imaging.  May have a component of dehydration with the feeling bad and the brain fog.  Do not think we need further workup at this time however.  Will discharge.        Final Clinical Impression(s) / ED Diagnoses Final diagnoses:  Dehydration  Renal mass    Rx / DC Orders ED Discharge Orders          Ordered    ketorolac (TORADOL) 10 MG tablet  Every 6 hours PRN  12/11/22 1546              Benjiman Core, MD 12/11/22 1549

## 2022-12-11 NOTE — ED Notes (Signed)
Patient returned from CT

## 2022-12-11 NOTE — ED Triage Notes (Signed)
Pt arrives to ED with c/o abdominal pains, diarrhea, weight loss, malaise, fatigue x2 weeks since eating a [place] sandwich [place] the beach. Pt does note worsening left flank to LLQ abdominal pains today.

## 2022-12-11 NOTE — ED Notes (Signed)
Patient transported to CT 

## 2022-12-14 ENCOUNTER — Ambulatory Visit: Payer: Commercial Managed Care - PPO | Admitting: Medical

## 2022-12-18 ENCOUNTER — Other Ambulatory Visit: Payer: Self-pay | Admitting: Family Medicine

## 2022-12-18 DIAGNOSIS — F419 Anxiety disorder, unspecified: Secondary | ICD-10-CM

## 2022-12-19 MED ORDER — ALPRAZOLAM 0.25 MG PO TABS: 0.2500 mg | ORAL_TABLET | Freq: Every evening | ORAL | 0 refills | Status: DC | PRN

## 2022-12-23 ENCOUNTER — Other Ambulatory Visit: Payer: Commercial Managed Care - PPO

## 2022-12-24 ENCOUNTER — Encounter: Payer: Self-pay | Admitting: Family Medicine

## 2022-12-24 ENCOUNTER — Ambulatory Visit: Payer: Commercial Managed Care - PPO | Admitting: Family Medicine

## 2022-12-24 VITALS — BP 138/84 | HR 73 | Temp 98.2°F | Resp 16 | Wt 189.0 lb

## 2022-12-24 DIAGNOSIS — N2889 Other specified disorders of kidney and ureter: Secondary | ICD-10-CM

## 2022-12-24 DIAGNOSIS — R634 Abnormal weight loss: Secondary | ICD-10-CM | POA: Diagnosis not present

## 2022-12-24 DIAGNOSIS — R630 Anorexia: Secondary | ICD-10-CM

## 2022-12-24 NOTE — Progress Notes (Signed)
With a platelet count not this weekend no need for the Neulasta,  Subjective:    Patient ID: Arthur Ho, male    DOB: 1975/11/05, 47 y.o.   MRN: [id number]  HPI He is here for a recheck.  He was seen August 2 for evaluation of diarrhea, anorexia.  This all started after he ate a sandwich.  He states that at that time the symptoms were very similar to when he had COVID several months before that.  He was seen again August 9 in the emergency room.  The emergency room record was evaluated.  He had blood work as well as a CT scan which did show a right renal mass.  He is not interested in pursuing that problem at the present time.  Symptoms now revolve around the anorexia, metallic taste in his mouth, brain fog.  As mentioned earlier these are exactly the same symptoms he had when he had COVID although with his last episode he did not check to see if he had a reoccurrence of COVID.  He has been on a probiotic as well.  He did use omeprazole which help with some reflux symptoms but he is not having reflux type symptoms.   Review of Systems     Objective:    Physical Exam Alert and in no distress. Tympanic membranes and canals are normal. Pharyngeal area is normal. Neck is supple without adenopathy or thyromegaly. Cardiac exam shows a regular sinus rhythm without murmurs or gallops. Lungs are clear to auscultation.  Abdominal exam showed decreased bowel sounds without masses or tenderness       Assessment & Plan:   Weight loss - Plan: CBC with Differential/Platelet, Comprehensive metabolic panel, Sedimentation rate  Anorexia - Plan: CBC with Differential/Platelet, Comprehensive metabolic panel, Sedimentation rate  Right renal mass He is to continue on the probiotic.  Possible referral to GI if he continues to have this although this does sound like this could be long-haul COVID.

## 2022-12-24 NOTE — Patient Instructions (Signed)
Continue to take  the probiotic to your regimen

## 2022-12-25 ENCOUNTER — Ambulatory Visit: Payer: Commercial Managed Care - PPO | Admitting: Psychology

## 2022-12-25 ENCOUNTER — Encounter: Payer: Self-pay | Admitting: Family Medicine

## 2022-12-25 DIAGNOSIS — F411 Generalized anxiety disorder: Secondary | ICD-10-CM

## 2022-12-25 LAB — COMPREHENSIVE METABOLIC PANEL
ALT: 47 IU/L — ABNORMAL HIGH (ref 0–44)
AST: 24 IU/L (ref 0–40)
Albumin: 4.6 g/dL (ref 4.1–5.1)
Alkaline Phosphatase: 65 IU/L (ref 44–121)
BUN/Creatinine Ratio: 8 — ABNORMAL LOW (ref 9–20)
BUN: 8 mg/dL (ref 6–24)
Bilirubin Total: 0.5 mg/dL (ref 0.0–1.2)
CO2: 22 mmol/L (ref 20–29)
Calcium: 9.9 mg/dL (ref 8.7–10.2)
Chloride: 103 mmol/L (ref 96–106)
Creatinine, Ser: 0.99 mg/dL (ref 0.76–1.27)
Globulin, Total: 2.8 g/dL (ref 1.5–4.5)
Glucose: 89 mg/dL (ref 70–99)
Potassium: 4.2 mmol/L (ref 3.5–5.2)
Sodium: 142 mmol/L (ref 134–144)
Total Protein: 7.4 g/dL (ref 6.0–8.5)
eGFR: 95 mL/min/{1.73_m2} (ref >59)

## 2022-12-25 LAB — CBC WITH DIFFERENTIAL/PLATELET
Basophils Absolute: 0.1 10*3/uL (ref 0.0–0.2)
Basos: 1 %
EOS (ABSOLUTE): 0.1 10*3/uL (ref 0.0–0.4)
Eos: 1 %
Hematocrit: 44.9 % (ref 37.5–51.0)
Hemoglobin: 15.1 g/dL (ref 13.0–17.7)
Immature Grans (Abs): 0 10*3/uL (ref 0.0–0.1)
Immature Granulocytes: 0 %
Lymphocytes Absolute: 1.5 10*3/uL (ref 0.7–3.1)
Lymphs: 21 %
MCH: 30.3 pg (ref 26.6–33.0)
MCHC: 33.6 g/dL (ref 31.5–35.7)
MCV: 90 fL (ref 79–97)
Monocytes Absolute: 0.7 10*3/uL (ref 0.1–0.9)
Monocytes: 9 %
Neutrophils Absolute: 4.8 10*3/uL (ref 1.4–7.0)
Neutrophils: 68 %
Platelets: 410 10*3/uL (ref 150–450)
RBC: 4.99 x10E6/uL (ref 4.14–5.80)
RDW: 11.6 % (ref 11.6–15.4)
WBC: 7.1 10*3/uL (ref 3.4–10.8)

## 2022-12-25 LAB — SEDIMENTATION RATE: Sed Rate: 32 mm/h — ABNORMAL HIGH (ref 0–15)

## 2022-12-25 NOTE — Addendum Note (Signed)
Addended by: Ronnald Nian on: 12/25/2022 09:12 AM   Modules accepted: Orders

## 2022-12-25 NOTE — Progress Notes (Signed)
12/25/2022  Treatment Plan: Diagnosis F41.1 (Generalized anxiety disorder) [n/a]  Symptoms Excessive and/or unrealistic worry that is difficult to control occurring more days than not for at least 6 months about a number of events or activities. (Status: maintained) -- No Description Entered  Hypervigilance (e.g., feeling constantly on edge, experiencing concentration difficulties, having trouble falling or staying asleep, exhibiting a general state of irritability). (Status: maintained) -- No Description Entered  Medication Status compliance  Safety none  If Suicidal or Homicidal State Action Taken: unspecified  Current Risk: low Medications xanax (Dosage: unknown)  Objectives Related Problem: Learn and implement coping skills that result in a reduction of anxiety and worry, and improved daily functioning. Description: Reestablish a consistent sleep-wake cycle. Target Date: 2023-04-27 Frequency: Daily Modality: individual Progress: 80%  Related Problem: Learn and implement coping skills that result in a reduction of anxiety and worry, and improved daily functioning. Description: Maintain involvement in work, family, and social activities. Target Date: 2023-04-27 Frequency: Daily Modality: individual Progress: 80%  Related Problem: Learn and implement coping skills that result in a reduction of anxiety and worry, and improved daily functioning. Description: Learn and implement problem-solving strategies for realistically addressing worries. Target Date: 2023-04-27 Frequency: Daily Modality: individual Progress: 80%  Related Problem: Learn and implement coping skills that result in a reduction of anxiety and worry, and improved daily functioning. Description: Complete a medical evaluation to assess for possible contribution of medical or substance-related  conditions to the anxiety. Target Date: 2023-04-27 Frequency: Daily Modality: individual Progress: 95%  Related Problem: Learn and implement coping skills that result in a reduction of anxiety and worry, and improved daily functioning. Description: Describe situations, thoughts, feelings, and actions associated with anxieties and worries, their impact on functioning, and attempts to resolve them. Target Date: 2023-04-27 Frequency: Daily Modality: individual Progress: 80%  Client Response full compliance  Service Location Location, [address] [place], [place] [address]  Service Code cpt [phone] P Identify automatic thoughts  Facilitate problem solving  Session Notes:  Dx.: Generalized Anxiety  Meds.: Xanax  Patient consents to have a video Caregility video session and understands the limitations of this platform. He is at home and I am at my home office.   Goals: States that he is seeking therapy to reduce extreme anxiety and the associated cognitive and physical distress. Tends to have a variety of somatic complaints that he hopes to resolve. Will utilize FOO exploration and cognitive behavioral strategies. Target date is 5-22. Revised target is 12-24. Session notes: Jeshaun has been having a number of medical/physical symptoms. Doctor feels that it may be symptoms of long Covid. Has been to the ED and has been suffering some of his anxiety. This is the worst his anxiety has been in a long while. He is not using THC to manage as he did in the past. He will take one Xanax at night and it is very helpful. We talked about taking half of his .25 mg dose during the day IF needed. Suggested he talk to his doctor. His mother is coming for Labor Day. He feels it will be a good visit and he  looks forward to it. Hasn't seen her for a year. Also, he is scheduled to go to [place] with his son to see races. He plays Ipad games to help take his mind off of his "problems" and will try to start getting  back to exercise.                                                                                Garrel Ridgel, PhD Time: 9:40a-10:30a 50 min.

## 2022-12-26 ENCOUNTER — Other Ambulatory Visit: Payer: Self-pay | Admitting: Medical

## 2022-12-29 ENCOUNTER — Encounter: Payer: Self-pay | Admitting: Gastroenterology

## 2022-12-29 ENCOUNTER — Ambulatory Visit (INDEPENDENT_AMBULATORY_CARE_PROVIDER_SITE_OTHER): Payer: Commercial Managed Care - PPO | Admitting: Gastroenterology

## 2022-12-29 VITALS — BP 120/70 | HR 71 | Ht 69.0 in | Wt 190.5 lb

## 2022-12-29 DIAGNOSIS — N2889 Other specified disorders of kidney and ureter: Secondary | ICD-10-CM | POA: Diagnosis not present

## 2022-12-29 DIAGNOSIS — R112 Nausea with vomiting, unspecified: Secondary | ICD-10-CM

## 2022-12-29 DIAGNOSIS — R197 Diarrhea, unspecified: Secondary | ICD-10-CM | POA: Diagnosis not present

## 2022-12-29 DIAGNOSIS — Z1211 Encounter for screening for malignant neoplasm of colon: Secondary | ICD-10-CM

## 2022-12-29 NOTE — Patient Instructions (Signed)
   _______________________________________________________  If your blood pressure at your visit was 140/90 or greater, please contact your primary care physician to follow up on this.  _______________________________________________________  If you are age 46 or older, your body mass index should be between 23-30. Your Body mass index is 28.13 kg/m. If this is out of the aforementioned range listed, please consider follow up with your Primary Care Provider.  If you are age 76 or younger, your body mass index should be between 19-25. Your Body mass index is 28.13 kg/m. If this is out of the aformentioned range listed, please consider follow up with your Primary Care Provider.   ________________________________________________________  The [facility] providers would like to encourage you to use [facility] to communicate with providers for non-urgent requests or questions.  Due to long hold times on the telephone, sending your provider a message by [facility] may be a faster and more efficient way to get a response.  Please allow 48 business hours for a response.  Please remember that this is for non-urgent requests.  _______________________________________________________ It was a pleasure to see you today!  Thank you for trusting me with your gastrointestinal care!

## 2022-12-29 NOTE — Progress Notes (Signed)
Chief Complaint: Diarrhea Primary GI MD: Dr. Russella Dar  HPI: 47 year old male with history as listed below presents for evaluation of diarrhea and GERD.  Patient states he was on vacation June 28 when he consumed a deli [place] sandwich from room service and a few hours after that he developed severe diarrhea and uneasiness in his abdomen, nausea and vomiting.  He also had an episode that same weekend where he ate a bowl of ice cream prior to going to sleep and when he woke up the next morning he felt like there was something in his throat similar to globus sensation.  He reports feeling feverish and having chills at this time as well.  He is concerned with the ongoing recall of Bors had [place] with link to [place]. No one else consumed similar meal (his wife ate soup).  Over the last few weeks his symptoms have improved.  He has not had another globus sensation or episode of GERD.  He also is having soft formed bowel movements without straining.  He states he has gone from around 212 pounds to 190 pounds over the last few weeks.  This is both unintentional and intentional.  He states after his bout of diarrhea he was hesitant to eat for a few weeks causing some weight loss.  He also has intentionally cut out sweets and soda in and attempt to lose weight and become healthier.  He has been on a probiotic which he feels has helped. He does report some brain fog that has been improving. Notes when he had COVID a few years ago he had these exact same symptoms as well. Has not been tested. No sick contacts.  Patient has a Cologuard kit at home that he plans to complete for colon cancer screening.  Not interested in colonoscopy at this time.  Of note, patient was recently seen in emergency department 12/11/2018 for after he was having left flank pain and noticed he saw a kidney stone recently.    CT renal study showed bilateral nonobstructive nephrolithiasis and 1.6 cm solid-appearing abnormality in the  upper pole of right kidney with recommendation to follow through with MRI. CBC and CMP unrevealing ESR 32  Past Medical History:  Diagnosis Date   Anxiety    Calcium nephrolithiasis    GERD (gastroesophageal reflux disease)     Past Surgical History:  Procedure Laterality Date   HERNIA REPAIR     R inguinal   KIDNEY STONE SURGERY     basket retrieval x several   LITHOTRIPSY     x2    Current Outpatient Medications  Medication Sig Dispense Refill   ALPRAZolam (XANAX) 0.25 MG tablet Take 1 tablet (0.25 mg total) by mouth at bedtime as needed for anxiety. 30 tablet 0   rosuvastatin (CRESTOR) 40 MG tablet Take 1 tablet (40 mg total) by mouth daily. (Patient not taking: Reported on 12/29/2022) 90 tablet 3   No current facility-administered medications for this visit.    Allergies as of 12/29/2022 - Review Complete 12/29/2022  Allergen Reaction Noted   Contrast media [iodinated contrast media] Palpitations 10/29/2014   Penicillins Anaphylaxis    Sulfa antibiotics  10/14/2017    Family History  Problem Relation Age of Onset   Atrial fibrillation Father    Arrhythmia Brother    Allergies Maternal Grandfather    Vision loss Maternal Grandfather    Emphysema Maternal Grandfather    Nephrolithiasis Maternal Grandfather    Colon cancer Paternal Grandfather  Rectal cancer Neg Hx    Stomach cancer Neg Hx    Esophageal cancer Neg Hx    Liver cancer Neg Hx    Pancreatic cancer Neg Hx     Social History   Socioeconomic History   Marital status: Married    Spouse name: Not on file   Number of children: 1   Years of education: Not on file   Highest education level: Not on file  Occupational History   Occupation: Advertising account planner    Employer: [employer]  Tobacco Use   Smoking status: Former    Current packs/day: 0.00    Types: Cigarettes    Quit date: 05/04/2008    Years since quitting: 14.6   Smokeless tobacco: Never  Vaping Use   Vaping status: Never  Used  Substance and Sexual Activity   Alcohol use: No   Drug use: No   Sexual activity: Yes  Other Topics Concern   Not on file  Social History Narrative   Not on file   Social Determinants of Health   Financial Resource Strain: Patient Declined (10/21/2022)   Overall Financial Resource Strain (CARDIA)    Difficulty of Paying Living Expenses: Patient declined  Food Insecurity: No Food Insecurity (10/21/2022)   Hunger Vital Sign    Worried About Running Out of Food in the Last Year: Never true    Ran Out of Food in the Last Year: Never true  Transportation Needs: No Transportation Needs (10/21/2022)   PRAPARE - Administrator, Civil Service (Medical): No    Lack of Transportation (Non-Medical): No  Physical Activity: Sufficiently Active (10/21/2022)   Exercise Vital Sign    Days of Exercise per Week: 3 days    Minutes of Exercise per Session: 60 min  Stress: Patient Declined (10/21/2022)   [employer] of Occupational Health - Occupational Stress Questionnaire    Feeling of Stress : Patient declined  Social Connections: Moderately Isolated (10/21/2022)   Social Connection and Isolation Panel [NHANES]    Frequency of Communication with Friends and Family: Twice a week    Frequency of Social Gatherings with Friends and Family: Twice a week    Attends Religious Services: Never    Database administrator or Organizations: No    Attends Banker Meetings: Never    Marital Status: Married  Catering manager Violence: Not At Risk (10/21/2022)   Humiliation, Afraid, Rape, and Kick questionnaire    Fear of Current or Ex-Partner: No    Emotionally Abused: No    Physically Abused: No    Sexually Abused: No    Review of Systems:    Constitutional: No weight loss, fever, chills, weakness or fatigue HEENT: Eyes: No change in vision               Ears, Nose, Throat:  No change in hearing or congestion Skin: No rash or itching Cardiovascular: No chest pain, chest  pressure or palpitations   Respiratory: No SOB or cough Gastrointestinal: See HPI and otherwise negative Genitourinary: No dysuria or change in urinary frequency Neurological: No headache, dizziness or syncope Musculoskeletal: No new muscle or joint pain Hematologic: No bleeding or bruising Psychiatric: No history of depression or anxiety    Physical Exam:  Vital signs: There were no vitals taken for this visit.  Constitutional: NAD, Well developed, Well nourished, alert and cooperative Head:  Normocephalic and atraumatic. Eyes:   PEERL, EOMI. No icterus. Conjunctiva pink. Respiratory: Respirations even and  unlabored. Lungs clear to auscultation bilaterally.   No wheezes, crackles, or rhonchi.  Cardiovascular:  Regular rate and rhythm. No peripheral edema, cyanosis or pallor.  Gastrointestinal:  Soft, nondistended, nontender. No rebound or guarding. Normal bowel sounds. No appreciable masses or hepatomegaly. Rectal:  Not performed.  Msk:  Symmetrical without gross deformities. Without edema, no deformity or joint abnormality.  Neurologic:  Alert and  oriented x4;  grossly normal neurologically.  Skin:   Dry and intact without significant lesions or rashes. Psychiatric: Oriented to person, place and time. Demonstrates good judgement and reason without abnormal affect or behaviors.   RELEVANT LABS AND IMAGING: CBC    Component Value Date/Time   WBC 7.1 12/24/2022 1504   WBC 12.3 (H) 12/11/2022 1232   RBC 4.99 12/24/2022 1504   RBC 5.05 12/11/2022 1232   HGB 15.1 12/24/2022 1504   HCT 44.9 12/24/2022 1504   PLT 410 12/24/2022 1504   MCV 90 12/24/2022 1504   MCH 30.3 12/24/2022 1504   MCH 31.3 12/11/2022 1232   MCHC 33.6 12/24/2022 1504   MCHC 34.7 12/11/2022 1232   RDW 11.6 12/24/2022 1504   LYMPHSABS 1.5 12/24/2022 1504   MONOABS 0.8 04/01/2012 1604   EOSABS 0.1 12/24/2022 1504   BASOSABS 0.1 12/24/2022 1504    CMP     Component Value Date/Time   NA 142 12/24/2022  1504   K 4.2 12/24/2022 1504   CL 103 12/24/2022 1504   CO2 22 12/24/2022 1504   GLUCOSE 89 12/24/2022 1504   GLUCOSE 110 (H) 12/11/2022 1232   BUN 8 12/24/2022 1504   CREATININE 0.99 12/24/2022 1504   CREATININE 1.33 02/17/2017 1237   CALCIUM 9.9 12/24/2022 1504   PROT 7.4 12/24/2022 1504   ALBUMIN 4.6 12/24/2022 1504   AST 24 12/24/2022 1504   ALT 47 (H) 12/24/2022 1504   ALKPHOS 65 12/24/2022 1504   BILITOT 0.5 12/24/2022 1504   GFRNONAA >60 12/11/2022 1232   GFRAA 101 02/02/2020 1626     Assessment/Plan:   Diarrhea of presumed infectious origin Nausea and vomiting, unspecified vomiting type Suspect patient had bout of gastroenteritis secondary to consumption of suspicious deli meat or even COVID infection since he reports history of similar symptoms with COVID. Symptoms have resolved and he is doing well now. Suspect elevated ESR from resolving infection versus kidney stones versus renal mass --- call us back if symptoms recur   Screening for colon cancer --- Complete cologuard study --- if negative, repeat in 3 years --- if positive, proceed with colonoscopy  [facility] 12/29/2022, 11:32 AM  Cc: Ronnald Nian, MD

## 2023-01-05 NOTE — Progress Notes (Signed)
Please confirm patient has Urology follow up for his right renal mass If Cologuard is negative and patient wishes to continue Cologuard screening this should be done through this PCP.

## 2023-01-07 NOTE — Progress Notes (Signed)
Patient states that he has not yet followed with urology and does not have a referral. Advised we will refer to [facility] and will reach out with an appointment when one is given to [place]. He verbalizes understanding.

## 2023-01-09 DIAGNOSIS — G4733 Obstructive sleep apnea (adult) (pediatric): Secondary | ICD-10-CM | POA: Diagnosis not present

## 2023-01-18 ENCOUNTER — Encounter: Payer: Self-pay | Admitting: *Deleted

## 2023-01-18 NOTE — Progress Notes (Signed)
I have spoken to triage nurse at [facility]. She states that they do have patient referral and she attempted to reach patient but he has not returned her call to date.

## 2023-01-22 NOTE — Progress Notes (Signed)
Per [facility], triage nurse has attempted to reach patient by phone on 01/13/23, 01/21/23 and sent a letter via [employer] on 01/21/23 as well. I also sent a mychart message to patient regarding urology appointment and that has not been read by the patient.

## 2023-01-25 ENCOUNTER — Ambulatory Visit: Payer: Commercial Managed Care - PPO | Admitting: Psychology

## 2023-01-29 ENCOUNTER — Ambulatory Visit (INDEPENDENT_AMBULATORY_CARE_PROVIDER_SITE_OTHER): Payer: Commercial Managed Care - PPO | Admitting: Psychology

## 2023-01-29 DIAGNOSIS — F411 Generalized anxiety disorder: Secondary | ICD-10-CM

## 2023-01-29 NOTE — Progress Notes (Addendum)
01/29/2023  Treatment Plan: Diagnosis F41.1 (Generalized anxiety disorder) [n/a]  Symptoms Excessive and/or unrealistic worry that is difficult to control occurring more days than not for at least 6 months about a number of events or activities. (Status: maintained) -- No Description Entered  Hypervigilance (e.g., feeling constantly on edge, experiencing concentration difficulties, having trouble falling or staying asleep, exhibiting a general state of irritability). (Status: maintained) -- No Description Entered  Medication Status compliance  Safety none  If Suicidal or Homicidal State Action Taken: unspecified  Current Risk: low Medications xanax (Dosage: unknown)  Objectives Related Problem: Learn and implement coping skills that result in a reduction of anxiety and worry, and improved daily functioning. Description: Reestablish a consistent sleep-wake cycle. Target Date: 2023-04-27 Frequency: Daily Modality: individual Progress: 80%  Related Problem: Learn and implement coping skills that result in a reduction of anxiety and worry, and improved daily functioning. Description: Maintain involvement in work, family, and social activities. Target Date: 2023-04-27 Frequency: Daily Modality: individual Progress: 80%  Related Problem: Learn and implement coping skills that result in a reduction of anxiety and worry, and improved daily functioning. Description: Learn and implement problem-solving strategies for realistically addressing worries. Target Date: 2023-04-27 Frequency: Daily Modality: individual Progress: 80%  Related Problem: Learn and implement coping skills that result in a reduction of anxiety and worry, and improved daily functioning. Description: Complete a medical evaluation to assess for possible contribution of  medical or substance-related conditions to the anxiety. Target Date: 2023-04-27 Frequency: Daily Modality: individual Progress: 95%  Related Problem: Learn and implement coping skills that result in a reduction of anxiety and worry, and improved daily functioning. Description: Describe situations, thoughts, feelings, and actions associated with anxieties and worries, their impact on functioning, and attempts to resolve them. Target Date: 2023-04-27 Frequency: Daily Modality: individual Progress: 80%  Client Response full compliance  Service Location Location, [address] [place], [place] [address]  Service Code cpt [phone] P Identify automatic thoughts  Facilitate problem solving  Session Notes:  Dx.: Generalized Anxiety  Meds.: Xanax  Patient consents to have a video Caregility video session and understands the limitations of this platform. He is at home and I am at my home office.   Goals: States that he is seeking therapy to reduce extreme anxiety and the associated cognitive and physical distress. Tends to have a variety of somatic complaints that he hopes to resolve. Will utilize FOO exploration and cognitive behavioral strategies. Target date is 5-22. Revised target is 12-24. Session notes: Wildon says that his health is better and he is 98% back. Just got back from [place] and it was a good trip. He said his father was miserable and doesn't like being with him. He has been feeling good, but notices that he will have periodic heart palpitations. He will take a half Xanax and it usually helps. Cannot identify any triggers. He is not stressing about anything in particular. We talked about how his physical changes tends to initiate some of his anxiety. His anxiety is not near the level  it was previously, but it still exists. He gets past his anxiety episodes much faster.                                                                                    Garrel Ridgel, PhD  Time: 9:40a-10:30a 50 min.

## 2023-02-01 ENCOUNTER — Ambulatory Visit: Payer: Commercial Managed Care - PPO | Admitting: Gastroenterology

## 2023-02-08 ENCOUNTER — Telehealth: Payer: Self-pay | Admitting: Gastroenterology

## 2023-02-08 DIAGNOSIS — G4733 Obstructive sleep apnea (adult) (pediatric): Secondary | ICD-10-CM | POA: Diagnosis not present

## 2023-02-08 NOTE — Telephone Encounter (Signed)
Bayley-FYI  Patient has been contacted on multiple occasions by [facility] to schedule evaluation of renal mass. He has not returned their call. I have also been in touch with him about this, so he is aware [facility] has attempted to reach him. See patient message dated 01/18/23. Also see addended notes to last GI office visit.

## 2023-02-08 NOTE — Telephone Encounter (Signed)
Inbound call from [facility], wanted to advise Arthur Ho that they have been unable to reach patient from the referral sent.

## 2023-02-22 ENCOUNTER — Ambulatory Visit: Payer: Commercial Managed Care - PPO | Admitting: Psychology

## 2023-03-03 ENCOUNTER — Other Ambulatory Visit: Payer: Self-pay | Admitting: Family Medicine

## 2023-03-03 DIAGNOSIS — F419 Anxiety disorder, unspecified: Secondary | ICD-10-CM

## 2023-03-03 MED ORDER — ALPRAZOLAM 0.25 MG PO TABS
0.2500 mg | ORAL_TABLET | Freq: Every evening | ORAL | 0 refills | Status: DC | PRN
Start: 2023-03-03 — End: 2023-06-29

## 2023-03-05 ENCOUNTER — Ambulatory Visit (INDEPENDENT_AMBULATORY_CARE_PROVIDER_SITE_OTHER): Payer: Commercial Managed Care - PPO | Admitting: Psychology

## 2023-03-05 DIAGNOSIS — F411 Generalized anxiety disorder: Secondary | ICD-10-CM

## 2023-03-05 NOTE — Progress Notes (Signed)
03/05/2023  Treatment Plan: Diagnosis F41.1 (Generalized anxiety disorder) [n/a]  Symptoms Excessive and/or unrealistic worry that is difficult to control occurring more days than not for at least 6 months about a number of events or activities. (Status: maintained) -- No Description Entered  Hypervigilance (e.g., feeling constantly on edge, experiencing concentration difficulties, having trouble falling or staying asleep, exhibiting a general state of irritability). (Status: maintained) -- No Description Entered  Medication Status compliance  Safety none  If Suicidal or Homicidal State Action Taken: unspecified  Current Risk: low Medications xanax (Dosage: unknown)  Objectives Related Problem: Learn and implement coping skills that result in a reduction of anxiety and worry, and improved daily functioning. Description: Reestablish a consistent sleep-wake cycle. Target Date: 2023-04-27 Frequency: Daily Modality: individual Progress: 80%  Related Problem: Learn and implement coping skills that result in a reduction of anxiety and worry, and improved daily functioning. Description: Maintain involvement in work, family, and social activities. Target Date: 2023-04-27 Frequency: Daily Modality: individual Progress: 80%  Related Problem: Learn and implement coping skills that result in a reduction of anxiety and worry, and improved daily functioning. Description: Learn and implement problem-solving strategies for realistically addressing worries. Target Date: 2023-04-27 Frequency: Daily Modality: individual Progress: 80%  Related Problem: Learn and implement coping skills that result in a reduction of anxiety and worry, and improved daily functioning. Description: Complete a medical evaluation to assess  for possible contribution of medical or substance-related conditions to the anxiety. Target Date: 2023-04-27 Frequency: Daily Modality: individual Progress: 95%  Related Problem: Learn and implement coping skills that result in a reduction of anxiety and worry, and improved daily functioning. Description: Describe situations, thoughts, feelings, and actions associated with anxieties and worries, their impact on functioning, and attempts to resolve them. Target Date: 2023-04-27 Frequency: Daily Modality: individual Progress: 80%  Client Response full compliance  Service Location Location, [address] [place], [place] [address]  Service Code cpt [phone] P Identify automatic thoughts  Facilitate problem solving  Session Notes:  Dx.: Generalized Anxiety  Meds.: Xanax  Patient consents to have a video Caregility video session and understands the limitations of this platform. He is at home and I am at my home office.   Goals: States that he is seeking therapy to reduce extreme anxiety and the associated cognitive and physical distress. Tends to have a variety of somatic complaints that he hopes to resolve. Will utilize FOO exploration and cognitive behavioral strategies. Target date is 5-22. Revised target is 12-24. Session notes: Taiden says that he had the "worst profit week of his career" last week and he thinks it is the election. He discussed his frustration with the election and his desire to "move on". He has not been getting overly anxious during these difficult times. He feels that he has greatly improved with putting aside those things in life that have caused him grief. There are family members that have caused him despair in the past and  he is no longer going to let them interfere with his well-being.                                                                                       Garrel Ridgel, PhD Time: 9:40a-10:30a 50 min.

## 2023-03-09 DIAGNOSIS — G4733 Obstructive sleep apnea (adult) (pediatric): Secondary | ICD-10-CM | POA: Diagnosis not present

## 2023-04-08 DIAGNOSIS — G4733 Obstructive sleep apnea (adult) (pediatric): Secondary | ICD-10-CM | POA: Diagnosis not present

## 2023-04-09 ENCOUNTER — Ambulatory Visit: Payer: Commercial Managed Care - PPO | Admitting: Psychology

## 2023-04-09 DIAGNOSIS — F411 Generalized anxiety disorder: Secondary | ICD-10-CM | POA: Diagnosis not present

## 2023-04-09 NOTE — Progress Notes (Signed)
04/09/2023  Treatment Plan: Diagnosis F41.1 (Generalized anxiety disorder) [n/a]  Symptoms Excessive and/or unrealistic worry that is difficult to control occurring more days than not for at least 6 months about a number of events or activities. (Status: maintained) -- No Description Entered  Hypervigilance (e.g., feeling constantly on edge, experiencing concentration difficulties, having trouble falling or staying asleep, exhibiting a general state of irritability). (Status: maintained) -- No Description Entered  Medication Status compliance  Safety none  If Suicidal or Homicidal State Action Taken: unspecified  Current Risk: low Medications xanax (Dosage: unknown)  Objectives Related Problem: Learn and implement coping skills that result in a reduction of anxiety and worry, and improved daily functioning. Description: Reestablish a consistent sleep-wake cycle. Target Date: 2024-04-26 Frequency: Daily Modality: individual Progress: 80%  Related Problem: Learn and implement coping skills that result in a reduction of anxiety and worry, and improved daily functioning. Description: Maintain involvement in work, family, and social activities. Target Date: 2024-04-26 Frequency: Daily Modality: individual Progress: 85%  Related Problem: Learn and implement coping skills that result in a reduction of anxiety and worry, and improved daily functioning. Description: Learn and implement problem-solving strategies for realistically addressing worries. Target Date: 2024-04-26 Frequency: Daily Modality: individual Progress: 85%  Related Problem: Learn and implement coping skills that result in a reduction of anxiety and worry, and improved daily functioning. Description: Complete  a medical evaluation to assess for possible contribution of medical or substance-related conditions to the anxiety. Target Date: 2024-04-26 Frequency: Daily Modality: individual Progress: 95%  Related Problem: Learn and implement coping skills that result in a reduction of anxiety and worry, and improved daily functioning. Description: Describe situations, thoughts, feelings, and actions associated with anxieties and worries, their impact on functioning, and attempts to resolve them. Target Date: 2024-04-26 Frequency: Daily Modality: individual Progress: 85%  Client Response full compliance  Service Location Location, [address] [place], [place] [address]  Service Code cpt [phone] P Identify automatic thoughts  Facilitate problem solving  Session Notes:  Dx.: Generalized Anxiety  Meds.: Xanax  Patient consents to have a video Caregility video session and understands the limitations of this platform. He is at home and I am at my home office.   Goals: States that he is seeking therapy to reduce extreme anxiety and the associated cognitive and physical distress. Tends to have a variety of somatic complaints that he hopes to resolve. Will utilize FOO exploration and cognitive behavioral strategies. Target date is 5-22. Revised target is 12-25. Session notes: Charistopher says business is slow, but anticipates it will pick up after the first of the year. Says that his holiday "was great". He took off 5 days and loved being away from work. Has not decided to lock in a work schedule during Christmas. Son flunked a class and he is at risk of not graduating high school. Lowell is unclear  how to address this situation. We discussed how to best address this situation. Eirik also talked about his mother's boyfriend, who claims to be schizophrenic. He is fed up with his mother. We talked about his use of gaming to soothe his anxiety.                                                                                          Garrel Ridgel, PhD Time: 10:40a-11:30a 50 min.

## 2023-05-09 DIAGNOSIS — G4733 Obstructive sleep apnea (adult) (pediatric): Secondary | ICD-10-CM | POA: Diagnosis not present

## 2023-05-14 ENCOUNTER — Encounter: Payer: Self-pay | Admitting: Family Medicine

## 2023-05-15 MED ORDER — FLUOCINONIDE EMULSIFIED BASE 0.05 % EX CREA: 1.0000 | TOPICAL_CREAM | Freq: Two times a day (BID) | CUTANEOUS | 1 refills | Status: AC

## 2023-05-26 ENCOUNTER — Ambulatory Visit: Payer: Commercial Managed Care - PPO | Admitting: Psychology

## 2023-05-26 DIAGNOSIS — F411 Generalized anxiety disorder: Secondary | ICD-10-CM | POA: Diagnosis not present

## 2023-05-26 NOTE — Progress Notes (Signed)
05/26/2023  Treatment Plan: Diagnosis F41.1 (Generalized anxiety disorder) [n/a]  Symptoms Excessive Arthur/or unrealistic worry that is difficult to control occurring more days than not for at least 6 months about a number of events or activities. (Status: maintained) -- No Description Entered  Hypervigilance (e.g., feeling constantly on edge, experiencing concentration difficulties, having trouble falling or staying asleep, exhibiting a general state of irritability). (Status: maintained) -- No Description Entered  Medication Status compliance  Safety none  If Suicidal or Homicidal State Action Taken: unspecified  Current Risk: low Medications xanax (Dosage: unknown)  Objectives Related Problem: Learn Arthur implement coping skills that result in a reduction of anxiety Arthur worry, Arthur improved daily functioning. Description: Reestablish a consistent sleep-wake cycle. Target Date: 2024-04-27 Frequency: Daily Modality: individual Progress: 80%  Related Problem: Learn Arthur implement coping skills that result in a reduction of anxiety Arthur worry, Arthur improved daily functioning. Description: Maintain involvement in work, family, Arthur social activities. Target Date: 2024-04-26 Frequency: Daily Modality: individual Progress: 85%  Related Problem: Learn Arthur implement coping skills that result in a reduction of anxiety Arthur worry, Arthur improved daily functioning. Description: Learn Arthur implement problem-solving strategies for realistically addressing worries. Target Date: 2024-04-27 Frequency: Daily Modality: individual Progress: 85%  Related Problem: Learn Arthur implement coping skills that result in a reduction of anxiety Arthur worry, Arthur improved daily  functioning. Description: Complete a medical evaluation to assess for possible contribution of medical or substance-related conditions to the anxiety. Target Date: 2024-04-27 Frequency: Daily Modality: individual Progress: 95%  Related Problem: Learn Arthur implement coping skills that result in a reduction of anxiety Arthur worry, Arthur improved daily functioning. Description: Describe situations, thoughts, feelings, Arthur actions associated with anxieties Arthur worries, their impact on functioning, Arthur attempts to resolve them. Target Date: 2024-04-27 Frequency: Daily Modality: individual Progress: 85%  Client Response full compliance  Service Location Location, [address] [place], [place] [address]  Service Code cpt [phone] P Identify automatic thoughts  Facilitate problem solving  Session Notes:  Dx.: Generalized Anxiety  Meds.: Xanax  Patient consents to have a video Caregility video session Arthur understands the limitations of this platform. He is at home Arthur I am at my home office.   Goals: States that he is seeking therapy to reduce extreme anxiety Arthur the associated cognitive Arthur physical distress. Tends to have a variety of somatic complaints that he hopes to resolve. Will utilize FOO exploration Arthur cognitive behavioral strategies. Target date is 5-22. Revised target is 12-25. Session notes: Arthur Ho, Arthur Ho, Arthur has shut him out. 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 Discussed how to emotionally process the rejection from Ho Arthur Ho.                                                                                            Garrel Ridgel, PhD Time: 2:10p-3:00p 50  min.

## 2023-06-09 ENCOUNTER — Ambulatory Visit: Payer: Commercial Managed Care - PPO | Admitting: Psychology

## 2023-06-21 ENCOUNTER — Ambulatory Visit: Payer: Commercial Managed Care - PPO | Admitting: Psychology

## 2023-06-21 DIAGNOSIS — F411 Generalized anxiety disorder: Secondary | ICD-10-CM

## 2023-06-21 NOTE — Progress Notes (Signed)
06/21/2023  Treatment Plan: Diagnosis F41.1 (Generalized anxiety disorder) [n/a]  Symptoms Excessive and/or unrealistic worry that is difficult to control occurring more days than not for at least 6 months about a number of events or activities. (Status: maintained) -- No Description Entered  Hypervigilance (e.g., feeling constantly on edge, experiencing concentration difficulties, having trouble falling or staying asleep, exhibiting a general state of irritability). (Status: maintained) -- No Description Entered  Medication Status compliance  Safety none  If Suicidal or Homicidal State Action Taken: unspecified  Current Risk: low Medications xanax (Dosage: unknown)  Objectives Related Problem: Learn and implement coping skills that result in a reduction of anxiety and worry, and improved daily functioning. Description: Reestablish a consistent sleep-wake cycle. Target Date: 2024-04-27 Frequency: Daily Modality: individual Progress: 80%  Related Problem: Learn and implement coping skills that result in a reduction of anxiety and worry, and improved daily functioning. Description: Maintain involvement in work, family, and social activities. Target Date: 2024-04-26 Frequency: Daily Modality: individual Progress: 85%  Related Problem: Learn and implement coping skills that result in a reduction of anxiety and worry, and improved daily functioning. Description: Learn and implement problem-solving strategies for realistically addressing worries. Target Date: 2024-04-27 Frequency: Daily Modality: individual Progress: 85%  Related Problem: Learn and implement coping skills that result in a reduction of anxiety and  worry, and improved daily functioning. Description: Complete a medical evaluation to assess for possible contribution of medical or substance-related conditions to the anxiety. Target Date: 2024-04-27 Frequency: Daily Modality: individual Progress: 95%  Related Problem: Learn and implement coping skills that result in a reduction of anxiety and worry, and improved daily functioning. Description: Describe situations, thoughts, feelings, and actions associated with anxieties and worries, their impact on functioning, and attempts to resolve them. Target Date: 2024-04-27 Frequency: Daily Modality: individual Progress: 85%  Client Response full compliance  Service Location Location, [address] [place], [place] [address]  Service Code cpt [phone] P Identify automatic thoughts  Facilitate problem solving  Session Notes:  Dx.: Generalized Anxiety  Meds.: Xanax  Patient consents to have a video Caregility video session and understands the limitations of this platform. He is at home and I am at my home office.   Goals: States that he is seeking therapy to reduce extreme anxiety and the associated cognitive and physical distress. Tends to have a variety of somatic complaints that he hopes to resolve. Will utilize FOO exploration and cognitive behavioral strategies. Target date is 5-22. Revised target is 12-25. Session notes: Arthur Ho says he has spent the day at the [facility] for his son. Very frustrated, but was able to get son's license renewed. He talked about his use of gummies to help  him calm down and sleep. We discussed ways to potentially be calm without substance. He has a "hard time shutting it down" and feels dependant on using the gummies to relax. He says his brain "never stops" and it is the only way he feels he can slow down.                                                                                               Garrel Ridgel, PhD Time: 2:10p-3:00p 50 min.

## 2023-06-29 ENCOUNTER — Encounter: Payer: Self-pay | Admitting: Internal Medicine

## 2023-06-29 ENCOUNTER — Other Ambulatory Visit: Payer: Self-pay | Admitting: Family Medicine

## 2023-06-29 DIAGNOSIS — F419 Anxiety disorder, unspecified: Secondary | ICD-10-CM

## 2023-06-29 NOTE — Telephone Encounter (Signed)
 Last apt 12/24/22 next apt 6/25 for CPE.

## 2023-06-30 MED ORDER — ALPRAZOLAM 0.25 MG PO TABS
0.2500 mg | ORAL_TABLET | Freq: Every evening | ORAL | 0 refills | Status: DC | PRN
Start: 2023-06-30 — End: 2023-09-03

## 2023-07-26 ENCOUNTER — Ambulatory Visit: Payer: Commercial Managed Care - PPO | Admitting: Psychology

## 2023-08-02 ENCOUNTER — Ambulatory Visit (INDEPENDENT_AMBULATORY_CARE_PROVIDER_SITE_OTHER): Admitting: Psychology

## 2023-08-02 DIAGNOSIS — F411 Generalized anxiety disorder: Secondary | ICD-10-CM

## 2023-08-02 NOTE — Progress Notes (Signed)
 08/02/2023  Treatment Plan: Diagnosis F41.1 (Generalized anxiety disorder) [n/a]  Symptoms Excessive and/or unrealistic worry that is difficult to control occurring more days than not for at least 6 months about a number of events or activities. (Status: maintained) -- No Description Entered  Hypervigilance (e.g., feeling constantly on edge, experiencing concentration difficulties, having trouble falling or staying asleep, exhibiting a general state of irritability). (Status: maintained) -- No Description Entered  Medication Status compliance  Safety none  If Suicidal or Homicidal State Action Taken: unspecified  Current Risk: low Medications xanax (Dosage: unknown)  Objectives Related Problem: Learn and implement coping skills that result in a reduction of anxiety and worry, and improved daily functioning. Description: Reestablish a consistent sleep-wake cycle. Target Date: 2024-04-27 Frequency: Daily Modality: individual Progress: 80%  Related Problem: Learn and implement coping skills that result in a reduction of anxiety and worry, and improved daily functioning. Description: Maintain involvement in work, family, and social activities. Target Date: 2024-04-26 Frequency: Daily Modality: individual Progress: 85%  Related Problem: Learn and implement coping skills that result in a reduction of anxiety and worry, and improved daily functioning. Description: Learn and implement problem-solving strategies for realistically addressing worries. Target Date: 2024-04-27 Frequency: Daily Modality: individual Progress: 85%  Related Problem: Learn and implement coping skills that result in  a reduction of anxiety and worry, and improved daily functioning. Description: Complete a medical evaluation to assess for possible contribution of medical or substance-related conditions to the anxiety. Target Date: 2024-04-27 Frequency: Daily Modality: individual Progress: 95%  Related Problem: Learn and implement coping skills that result in a reduction of anxiety and worry, and improved daily functioning. Description: Describe situations, thoughts, feelings, and actions associated with anxieties and worries, their impact on functioning, and attempts to resolve them. Target Date: 2024-04-27 Frequency: Daily Modality: individual Progress: 85%  Client Response full compliance  Service Location Location, [address] [place], [place] [address]  Service Code cpt [phone] P Identify automatic thoughts  Facilitate problem solving  Session Notes:  Dx.: Generalized Anxiety  Meds.: Xanax  Patient consents to have a video Caregility video session and understands the limitations of this platform. He is at home and I am at my home office.   Goals: States that he is seeking therapy to reduce extreme anxiety and the associated cognitive and physical distress. Tends to have a variety of somatic complaints that he hopes to resolve. Will utilize FOO exploration and cognitive behavioral strategies. Target date is 5-22. Revised target is 12-25. 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 He says he booked a "long  weekend at the beach" with his family. This is unusual for him and is a positive indication. Biggest stressor right now is that his son is failing and may not graduate. Also, neither of his parents are talking to him. He thinks they do not want to hear his opinion, especially his mom who is making some bad choices in her life. His cousin Gloris Manchester also does not talk to him and he is feeling shut out. He does have a relationship with his sister, whom he can relate to,  unlike everyone else. The lack of communications with others does not bother him, but the lack of communication with Gloris [place] is bothersome. He is hurt by her lack of response to him and does not understand why. He feels "really bad" for his kids because she has shut them out as well.                                                                                                   Arthur Ridgel, PhD Time: 11:40a-12:30p 50 min.

## 2023-08-30 ENCOUNTER — Ambulatory Visit: Admitting: Psychology

## 2023-09-03 ENCOUNTER — Other Ambulatory Visit: Payer: Self-pay | Admitting: Nurse Practitioner

## 2023-09-03 DIAGNOSIS — F419 Anxiety disorder, unspecified: Secondary | ICD-10-CM

## 2023-09-05 MED ORDER — ALPRAZOLAM 0.25 MG PO TABS
0.2500 mg | ORAL_TABLET | Freq: Every evening | ORAL | 0 refills | Status: DC | PRN
Start: 2023-09-05 — End: 2023-10-26

## 2023-09-06 DIAGNOSIS — G4733 Obstructive sleep apnea (adult) (pediatric): Secondary | ICD-10-CM | POA: Diagnosis not present

## 2023-10-01 ENCOUNTER — Ambulatory Visit: Admitting: Psychology

## 2023-10-01 DIAGNOSIS — F411 Generalized anxiety disorder: Secondary | ICD-10-CM | POA: Diagnosis not present

## 2023-10-01 NOTE — Progress Notes (Signed)
 10/01/2023  Treatment Plan: Diagnosis F41.1 (Generalized anxiety disorder) [n/a]  Symptoms Excessive and/or unrealistic worry that is difficult to control occurring more days than not for at least 6 months about a number of events or activities. (Status: maintained) -- No Description Entered  Hypervigilance (e.g., feeling constantly on edge, experiencing concentration difficulties, having trouble falling or staying asleep, exhibiting a general state of irritability). (Status: maintained) -- No Description Entered  Medication Status compliance  Safety none  If Suicidal or Homicidal State Action Taken: unspecified  Current Risk: low Medications xanax  (Dosage: unknown)  Objectives Related Problem: Learn and implement coping skills that result in a reduction of anxiety and worry, and improved daily functioning. Description: Reestablish a consistent sleep-wake cycle. Target Date: 2024-04-27 Frequency: Daily Modality: individual Progress: 80%  Related Problem: Learn and implement coping skills that result in a reduction of anxiety and worry, and improved daily functioning. Description: Maintain involvement in work, family, and social activities. Target Date: 2024-04-26 Frequency: Daily Modality: individual Progress: 85%  Related Problem: Learn and implement coping skills that result in a reduction of anxiety and worry, and improved daily functioning. Description: Learn and implement problem-solving strategies for realistically addressing worries. Target Date: 2024-04-27 Frequency: Daily Modality: individual Progress: 85%  Related Problem: Learn and  implement coping skills that result in a reduction of anxiety and worry, and improved daily functioning. Description: Complete a medical evaluation to assess for possible contribution of medical or substance-related conditions to the anxiety. Target Date: 2024-04-27 Frequency: Daily Modality: individual Progress: 95%  Related Problem: Learn and implement coping skills that result in a reduction of anxiety and worry, and improved daily functioning. Description: Describe situations, thoughts, feelings, and actions associated with anxieties and worries, their impact on functioning, and attempts to resolve them. Target Date: 2024-04-27 Frequency: Daily Modality: individual Progress: 85%  Client Response full compliance  Service Location Location, [address] [place], [place] [address]  Service Code cpt [phone] P Identify automatic thoughts  Facilitate problem solving  Session Notes:  Dx.: Generalized Anxiety  Meds.: Xanax   Patient consents to have a video Caregility video session and understands the limitations of this platform. He is at home and I am at my home office.   Goals: States that he is seeking therapy to reduce extreme anxiety and the associated cognitive and physical distress. Tends to have a variety of somatic complaints that he hopes to resolve. Will utilize FOO exploration and cognitive behavioral strategies. Target date is 5-22. Revised target is 12-25. Session notes: [place] states  that business has been great. He has had some anxieties. He is not sure son will graduate and some family are questioning whether they will be coming to town. Says people are irritating him lately. Told father that he always initiates calls and if he wants to talk, then he (father) will have to call. Has not heard from father (other than texts) in a long time. He is really bothered about how his family is treating his children. He is hurt that his family is distancing themselves from son's "big  event". He is also very frustrated with son who does not feel any urgency. He is most upset about how Sherrlyn Dolores has rejected his kids after being "so close. Talked about his disappointments and how to manage.                                                                                                    Jola Nash, PhD Time: 9:45a-10:30a 45 min.

## 2023-10-26 ENCOUNTER — Encounter: Payer: Self-pay | Admitting: Family Medicine

## 2023-10-26 ENCOUNTER — Ambulatory Visit: Payer: Commercial Managed Care - PPO | Admitting: Family Medicine

## 2023-10-26 VITALS — BP 126/80 | HR 76 | Ht 67.0 in | Wt 211.2 lb

## 2023-10-26 DIAGNOSIS — K219 Gastro-esophageal reflux disease without esophagitis: Secondary | ICD-10-CM | POA: Diagnosis not present

## 2023-10-26 DIAGNOSIS — R002 Palpitations: Secondary | ICD-10-CM

## 2023-10-26 DIAGNOSIS — F411 Generalized anxiety disorder: Secondary | ICD-10-CM | POA: Diagnosis not present

## 2023-10-26 DIAGNOSIS — E782 Mixed hyperlipidemia: Secondary | ICD-10-CM | POA: Diagnosis not present

## 2023-10-26 DIAGNOSIS — F419 Anxiety disorder, unspecified: Secondary | ICD-10-CM | POA: Insufficient documentation

## 2023-10-26 DIAGNOSIS — N2889 Other specified disorders of kidney and ureter: Secondary | ICD-10-CM | POA: Diagnosis not present

## 2023-10-26 DIAGNOSIS — Z1211 Encounter for screening for malignant neoplasm of colon: Secondary | ICD-10-CM | POA: Diagnosis not present

## 2023-10-26 DIAGNOSIS — E559 Vitamin D deficiency, unspecified: Secondary | ICD-10-CM

## 2023-10-26 DIAGNOSIS — Z0000 Encounter for general adult medical examination without abnormal findings: Secondary | ICD-10-CM

## 2023-10-26 DIAGNOSIS — G4733 Obstructive sleep apnea (adult) (pediatric): Secondary | ICD-10-CM

## 2023-10-26 DIAGNOSIS — Z87442 Personal history of urinary calculi: Secondary | ICD-10-CM | POA: Diagnosis not present

## 2023-10-26 MED ORDER — ALPRAZOLAM 0.25 MG PO TABS
0.2500 mg | ORAL_TABLET | Freq: Every evening | ORAL | 0 refills | Status: DC | PRN
Start: 2023-10-26 — End: 2024-01-13

## 2023-10-26 NOTE — Progress Notes (Signed)
 Complete physical exam  Patient: Arthur Ho   DOB: February 21, 1976   47 y.o. Male  MRN: [id number]  Subjective:    Chief Complaint  Patient presents with   Annual Exam    Fasting. Cpe.     Arthur Ho is a 48 y.o. male who presents today for a complete physical exam.  He reports consuming a general diet. walking He generally feels well. He reports sleeping well.  He continues in counseling for his underlying anxiety and is doing very well with this.  He realizes that his palpitations are really more of a stress related issue.  He is happy with the progress that he has made.  He does have OSA and is using his CPAP.  He does have hyperlipidemia but is not taking Crestor  stating he did not like the feeling that it was causing.  He has a history of renal stones but has not had difficulty with that in quite some time.  Previous scan for renal stones did show a right renal lesion that needs follow-up.  He has reflux disease but is not taking them on a regular basis.  Most recent fall risk assessment:    10/21/2022    1:51 PM  Fall Risk   Falls in the past year? 0  Number falls in past yr: 0  Injury with Fall? 0  Risk for fall due to : No Fall Risks  Follow up Falls evaluation completed     Most recent depression screenings:    10/26/2023    1:51 PM 10/21/2022    1:52 PM  PHQ 2/9 Scores  PHQ - 2 Score 0 0    Vision:Within last year and Dental: No current dental problems and Receives regular dental care    Immunization History  Administered Date(s) Administered   Influenza-Unspecified 03/26/2021   Tdap 02/15/2017    Health Maintenance  Topic Date Due   HIV Screening  Never done   Hepatitis B Vaccines (1 of 3 - 19+ 3-dose series) Never done   Colonoscopy  Never done   COVID-19 Vaccine (1 - 2024-25 season) Never done   INFLUENZA VACCINE  12/03/2023   DTaP/Tdap/Td (2 - Td or Tdap) 02/16/2027   Hepatitis C Screening  Completed   HPV VACCINES  Aged Out   Meningococcal B  Vaccine  Aged Out    Patient Care Team: Joyce Norleen BROCKS, MD as PCP - General (Family Medicine) Court Dorn PARAS, MD as PCP - Cardiology (Cardiology)   Outpatient Medications Prior to Visit  Medication Sig   fluocinonide -emollient (LIDEX -E) 0.05 % cream Apply 1 Application topically 2 (two) times daily.   [DISCONTINUED] ALPRAZolam  (XANAX ) 0.25 MG tablet Take 1 tablet (0.25 mg total) by mouth at bedtime as needed for anxiety.   rosuvastatin  (CRESTOR ) 40 MG tablet Take 1 tablet (40 mg total) by mouth daily. (Patient not taking: Reported on 10/26/2023)   No facility-administered medications prior to visit.    ROS  Family and social history as well as health maintenance and immunizations was reviewed.     Objective:      Physical Exam  Alert and in no distress. Tympanic membranes and canals are normal. Pharyngeal area is normal. Neck is supple without adenopathy or thyromegaly. Cardiac exam shows a regular sinus rhythm without murmurs or gallops. Lungs are clear to auscultation.      Assessment & Plan:     Routine general medical examination at a health care facility  Right renal mass -  Plan: US  Renal  Palpitations - Plan: CBC with Differential/Platelet, Comprehensive metabolic panel with GFR  Obstructive sleep apnea  Mixed hyperlipidemia - Plan: Lipid panel  History of kidney stones  Gastroesophageal reflux disease without esophagitis - Plan: CBC with Differential/Platelet, Comprehensive metabolic panel with GFR  GAD (generalized anxiety disorder) - Plan: ALPRAZolam  (XANAX ) 0.25 MG tablet  Vitamin D  insufficiency - Plan: VITAMIN D  25 Hydroxy (Vit-D Deficiency, Fractures)  Screening for colon cancer - Plan: Cologuard  His life right now is going quite well. Return in about 1 year (around 10/25/2024).      Norleen Jobs, MD

## 2023-10-27 ENCOUNTER — Ambulatory Visit: Payer: Self-pay | Admitting: Family Medicine

## 2023-10-27 LAB — CBC WITH DIFFERENTIAL/PLATELET
Basophils Absolute: 0.1 10*3/uL (ref 0.0–0.2)
Basos: 1 %
EOS (ABSOLUTE): 0.1 10*3/uL (ref 0.0–0.4)
Eos: 1 %
Hematocrit: 50.1 % (ref 37.5–51.0)
Hemoglobin: 16.5 g/dL (ref 13.0–17.7)
Immature Grans (Abs): 0 10*3/uL (ref 0.0–0.1)
Immature Granulocytes: 0 %
Lymphocytes Absolute: 1.6 10*3/uL (ref 0.7–3.1)
Lymphs: 20 %
MCH: 31.7 pg (ref 26.6–33.0)
MCHC: 32.9 g/dL (ref 31.5–35.7)
MCV: 96 fL (ref 79–97)
Monocytes Absolute: 0.6 10*3/uL (ref 0.1–0.9)
Monocytes: 7 %
Neutrophils Absolute: 5.6 10*3/uL (ref 1.4–7.0)
Neutrophils: 70 %
Platelets: 308 10*3/uL (ref 150–450)
RBC: 5.21 x10E6/uL (ref 4.14–5.80)
RDW: 12.1 % (ref 11.6–15.4)
WBC: 8 10*3/uL (ref 3.4–10.8)

## 2023-10-27 LAB — COMPREHENSIVE METABOLIC PANEL WITH GFR
ALT: 27 IU/L (ref 0–44)
AST: 24 IU/L (ref 0–40)
Albumin: 5 g/dL (ref 4.1–5.1)
Alkaline Phosphatase: 64 IU/L (ref 44–121)
BUN/Creatinine Ratio: 15 (ref 9–20)
BUN: 16 mg/dL (ref 6–24)
Bilirubin Total: 0.4 mg/dL (ref 0.0–1.2)
CO2: 20 mmol/L (ref 20–29)
Calcium: 9.8 mg/dL (ref 8.7–10.2)
Chloride: 100 mmol/L (ref 96–106)
Creatinine, Ser: 1.04 mg/dL (ref 0.76–1.27)
Globulin, Total: 2.7 g/dL (ref 1.5–4.5)
Glucose: 80 mg/dL (ref 70–99)
Potassium: 4.2 mmol/L (ref 3.5–5.2)
Sodium: 139 mmol/L (ref 134–144)
Total Protein: 7.7 g/dL (ref 6.0–8.5)
eGFR: 90 mL/min/{1.73_m2} (ref >59)

## 2023-10-27 LAB — LIPID PANEL
Chol/HDL Ratio: 5.3 ratio — ABNORMAL HIGH (ref 0.0–5.0)
Cholesterol, Total: 294 mg/dL — ABNORMAL HIGH (ref 100–199)
HDL: 56 mg/dL (ref >39)
LDL Chol Calc (NIH): 212 mg/dL — ABNORMAL HIGH (ref 0–99)
Triglycerides: 143 mg/dL (ref 0–149)
VLDL Cholesterol Cal: 26 mg/dL (ref 5–40)

## 2023-10-27 LAB — VITAMIN D 25 HYDROXY (VIT D DEFICIENCY, FRACTURES): Vit D, 25-Hydroxy: 26.7 ng/mL — ABNORMAL LOW (ref 30.0–100.0)

## 2023-10-27 MED ORDER — ATORVASTATIN CALCIUM 20 MG PO TABS: 20.0000 mg | ORAL_TABLET | Freq: Every day | ORAL | 3 refills | Status: DC

## 2023-10-27 NOTE — Addendum Note (Signed)
 Addended by: JOYCE NORLEEN BROCKS on: 10/27/2023 07:50 AM   Modules accepted: Orders

## 2023-11-01 ENCOUNTER — Ambulatory Visit: Admitting: Psychology

## 2023-11-04 ENCOUNTER — Other Ambulatory Visit

## 2023-11-05 MED ORDER — ATORVASTATIN CALCIUM 40 MG PO TABS: 40.0000 mg | ORAL_TABLET | Freq: Every day | ORAL | 3 refills | Status: AC

## 2023-11-05 NOTE — Addendum Note (Signed)
 Addended by: JOYCE NORLEEN BROCKS on: 11/05/2023 08:28 AM   Modules accepted: Orders

## 2023-11-10 ENCOUNTER — Other Ambulatory Visit

## 2023-11-15 ENCOUNTER — Ambulatory Visit (INDEPENDENT_AMBULATORY_CARE_PROVIDER_SITE_OTHER): Admitting: Psychology

## 2023-11-15 DIAGNOSIS — F411 Generalized anxiety disorder: Secondary | ICD-10-CM | POA: Diagnosis not present

## 2023-11-15 NOTE — Progress Notes (Signed)
 11/15/2023  Treatment Plan: Diagnosis F41.1 (Generalized anxiety disorder) [n/a]  Symptoms Excessive and/or unrealistic worry that is difficult to control occurring more days than not for at least 6 months about a number of events or activities. (Status: maintained) -- No Description Entered  Hypervigilance (e.g., feeling constantly on edge, experiencing concentration difficulties, having trouble falling or staying asleep, exhibiting a general state of irritability). (Status: maintained) -- No Description Entered  Medication Status compliance  Safety none  If Suicidal or Homicidal State Action Taken: unspecified  Current Risk: low Medications xanax  (Dosage: unknown)  Objectives Related Problem: Learn and implement coping skills that result in a reduction of anxiety and worry, and improved daily functioning. Description: Reestablish a consistent sleep-wake cycle. Target Date: 2024-04-27 Frequency: Daily Modality: individual Progress: 80%  Related Problem: Learn and implement coping skills that result in a reduction of anxiety and worry, and improved daily functioning. Description: Maintain involvement in work, family, and social activities. Target Date: 2024-04-26 Frequency: Daily Modality: individual Progress: 85%  Related Problem: Learn and implement coping skills that result in a reduction of anxiety and worry, and improved daily functioning. Description: Learn and implement problem-solving strategies for realistically addressing worries. Target Date: 2024-04-27 Frequency: Daily Modality: individual Progress:  85%  Related Problem: Learn and implement coping skills that result in a reduction of anxiety and worry, and improved daily functioning. Description: Complete a medical evaluation to assess for possible contribution of medical or substance-related conditions to the anxiety. Target Date: 2024-04-27 Frequency: Daily Modality: individual Progress: 95%  Related Problem: Learn and implement coping skills that result in a reduction of anxiety and worry, and improved daily functioning. Description: Describe situations, thoughts, feelings, and actions associated with anxieties and worries, their impact on functioning, and attempts to resolve them. Target Date: 2024-04-27 Frequency: Daily Modality: individual Progress: 85%  Client Response full compliance  Service Location Location, [address] [place], [place] [address]  Service Code cpt [phone] P Identify automatic thoughts  Facilitate problem solving  Session Notes:  Dx.: Generalized Anxiety  Meds.: Xanax   Patient consents to have a video Caregility video session and understands the limitations of this platform. He is at home and I am at my home office.   Goals: States that he is seeking therapy to reduce extreme anxiety and the associated cognitive and physical distress. Tends to have a variety of somatic complaints that he hopes to resolve. Will utilize FOO exploration and  cognitive behavioral strategies. Target date is 5-22. Revised target is 12-25. Session notes: Arthur Ho states that he is putting in a pool at his home. It is keeping him busy. His cholesterol is very high and he is now on a statin. He has a birthday on Monday and will be 48. The construction has caused him a great deal of stress and interfering with his sleep. He did finally call his aunt. They talked about some business related to contact, but did not address anything personal or about their relationship. Tried to explore why he did not bring it up, but he stated he is  not sure. Says he feels he can manage until the end of the project.                                                                                                         CONI ALM KERNS, PhD Time: 2:15p-3:00a 45 min.

## 2023-11-29 ENCOUNTER — Encounter: Payer: Self-pay | Admitting: Family Medicine

## 2023-12-06 ENCOUNTER — Ambulatory Visit
Admission: RE | Admit: 2023-12-06 | Discharge: 2023-12-06 | Disposition: A | Discharge status: Home / Self Care | Source: Ambulatory Visit | Attending: Family Medicine | Admitting: Family Medicine

## 2023-12-06 DIAGNOSIS — N2889 Other specified disorders of kidney and ureter: Secondary | ICD-10-CM | POA: Diagnosis not present

## 2023-12-08 DIAGNOSIS — G4733 Obstructive sleep apnea (adult) (pediatric): Secondary | ICD-10-CM | POA: Diagnosis not present

## 2023-12-13 ENCOUNTER — Ambulatory Visit (INDEPENDENT_AMBULATORY_CARE_PROVIDER_SITE_OTHER): Admitting: Psychology

## 2023-12-13 ENCOUNTER — Encounter: Payer: Self-pay | Admitting: Family Medicine

## 2023-12-13 DIAGNOSIS — F411 Generalized anxiety disorder: Secondary | ICD-10-CM

## 2023-12-13 DIAGNOSIS — N2889 Other specified disorders of kidney and ureter: Secondary | ICD-10-CM

## 2023-12-13 NOTE — Progress Notes (Signed)
 12/13/2023  Treatment Plan: Diagnosis F41.1 (Generalized anxiety disorder) [n/a]  Symptoms Excessive and/or unrealistic worry that is difficult to control occurring more days than not for at least 6 months about a number of events or activities. (Status: maintained) -- No Description Entered  Hypervigilance (e.g., feeling constantly on edge, experiencing concentration difficulties, having trouble falling or staying asleep, exhibiting a general state of irritability). (Status: maintained) -- No Description Entered  Medication Status compliance  Safety none  If Suicidal or Homicidal State Action Taken: unspecified  Current Risk: low Medications xanax  (Dosage: unknown)  Objectives Related Problem: Learn and implement coping skills that result in a reduction of anxiety and worry, and improved daily functioning. Description: Reestablish a consistent sleep-wake cycle. Target Date: 2024-04-27 Frequency: Daily Modality: individual Progress: 80%  Related Problem: Learn and implement coping skills that result in a reduction of anxiety and worry, and improved daily functioning. Description: Maintain involvement in work, family, and social activities. Target Date: 2024-04-26 Frequency: Daily Modality: individual Progress: 85%  Related Problem: Learn and implement coping skills that result in a reduction of anxiety and worry, and improved daily functioning. Description: Learn and implement problem-solving strategies for realistically addressing worries. Target Date: 2024-04-27 Frequency: Daily Modality:  individual Progress: 85%  Related Problem: Learn and implement coping skills that result in a reduction of anxiety and worry, and improved daily functioning. Description: Complete a medical evaluation to assess for possible contribution of medical or substance-related conditions to the anxiety. Target Date: 2024-04-27 Frequency: Daily Modality: individual Progress: 95%  Related Problem: Learn and implement coping skills that result in a reduction of anxiety and worry, and improved daily functioning. Description: Describe situations, thoughts, feelings, and actions associated with anxieties and worries, their impact on functioning, and attempts to resolve them. Target Date: 2024-04-27 Frequency: Daily Modality: individual Progress: 85%  Client Response full compliance  Service Location Location, [address] [place], [place] [address]  Service Code cpt [id number] P Identify automatic thoughts  Facilitate problem solving  Session Notes:  Dx.: Generalized Anxiety  Meds.: Xanax   Patient consents to have a video Caregility video session and understands the limitations of this platform. He is at home and I am at my home office.   Goals: States that he is seeking therapy to reduce extreme anxiety and the associated cognitive and physical distress. Tends to have  a variety of somatic complaints that he hopes to resolve. Will utilize FOO exploration and cognitive behavioral strategies. Target date is 5-22. Revised target is 12-25. Session notes: Arthur Ho states that he is busy and doing well. He completed pool on Friday and it is being filled. Reports that he is feeling better now that he is on a statin drug. He had ultrasound to look at a spot on kidney, but it was not seen. He has not been anxious about medical issues, which is a huge improvement for him. Still not sleeping as well as he would like. Once he is awake, he struggles to get back to sleep because his mind is racing. He grandfather  passed away last week. They were not especially close, but he loved the guy.                                                                                                           Arthur ALM KERNS, PhD Time: 2:10p-3:00a 50 min.

## 2023-12-20 NOTE — Telephone Encounter (Signed)
 I called Arthur Ho and he will be setting up a time to get MRI done.

## 2024-01-13 ENCOUNTER — Encounter: Payer: Self-pay | Admitting: Family Medicine

## 2024-01-13 DIAGNOSIS — F411 Generalized anxiety disorder: Secondary | ICD-10-CM

## 2024-01-14 MED ORDER — ALPRAZOLAM 0.25 MG PO TABS
0.2500 mg | ORAL_TABLET | Freq: Every evening | ORAL | 0 refills | Status: DC | PRN
Start: 2024-01-14 — End: 2024-03-22

## 2024-01-17 ENCOUNTER — Ambulatory Visit: Admitting: Psychology

## 2024-01-17 DIAGNOSIS — F411 Generalized anxiety disorder: Secondary | ICD-10-CM | POA: Diagnosis not present

## 2024-01-17 NOTE — Progress Notes (Signed)
 01/17/2024  Treatment Plan: Diagnosis F41.1 (Generalized anxiety disorder) [n/a]  Symptoms Excessive and/or unrealistic worry that is difficult to control occurring more days than not for at least 6 months about a number of events or activities. (Status: maintained) -- No Description Entered  Hypervigilance (e.g., feeling constantly on edge, experiencing concentration difficulties, having trouble falling or staying asleep, exhibiting a general state of irritability). (Status: maintained) -- No Description Entered  Medication Status compliance  Safety none  If Suicidal or Homicidal State Action Taken: unspecified  Current Risk: low Medications xanax  (Dosage: unknown)  Objectives Related Problem: Learn and implement coping skills that result in a reduction of anxiety and worry, and improved daily functioning. Description: Reestablish a consistent sleep-wake cycle. Target Date: 2024-04-27 Frequency: Daily Modality: individual Progress: 80%  Related Problem: Learn and implement coping skills that result in a reduction of anxiety and worry, and improved daily functioning. Description: Maintain involvement in work, family, and social activities. Target Date: 2024-04-26 Frequency: Daily Modality: individual Progress: 85%  Related Problem: Learn and implement coping skills that result in a reduction of anxiety and worry, and improved daily functioning. Description: Learn and implement problem-solving strategies for realistically addressing worries. Target Date:  2024-04-27 Frequency: Daily Modality: individual Progress: 85%  Related Problem: Learn and implement coping skills that result in a reduction of anxiety and worry, and improved daily functioning. Description: Complete a medical evaluation to assess for possible contribution of medical or substance-related conditions to the anxiety. Target Date: 2024-04-27 Frequency: Daily Modality: individual Progress: 95%  Related Problem: Learn and implement coping skills that result in a reduction of anxiety and worry, and improved daily functioning. Description: Describe situations, thoughts, feelings, and actions associated with anxieties and worries, their impact on functioning, and attempts to resolve them. Target Date: 2024-04-27 Frequency: Daily Modality: individual Progress: 85%  Client Response full compliance  Service Location Location, [address] [place], [place] [address]  Service Code cpt [phone] P Identify automatic thoughts  Facilitate problem solving  Session Notes:  Dx.: Generalized Anxiety  Meds.: Xanax   Patient consents to have a video Caregility video session and understands the limitations of this platform. He is at home and I am at my home office.   Goals: States that he is seeking  therapy to reduce extreme anxiety and the associated cognitive and physical distress. Tends to have a variety of somatic complaints that he hopes to resolve. Will utilize FOO exploration and cognitive behavioral strategies. Target date is 5-22. Revised target is 12-25. Session notes: Arthur Ho states that he is getting more business lately and is pleased. He has anxiety episodes with heart palpatations every couple of weeks and has to take a Xanax  to calm down. Talked about cognitive strategies to keep (or return to) calm.                                                                                                               CONI ALM KERNS, PhD Time: 2:10p-3:00a 50 min.

## 2024-03-22 ENCOUNTER — Encounter: Payer: Self-pay | Admitting: Family Medicine

## 2024-03-22 DIAGNOSIS — F411 Generalized anxiety disorder: Secondary | ICD-10-CM

## 2024-03-22 MED ORDER — ALPRAZOLAM 0.25 MG PO TABS: 0.2500 mg | ORAL_TABLET | Freq: Every evening | ORAL | 0 refills | Status: DC | PRN

## 2024-03-23 ENCOUNTER — Ambulatory Visit: Attending: Cardiovascular Disease | Admitting: Cardiovascular Disease

## 2024-03-23 ENCOUNTER — Encounter: Payer: Self-pay | Admitting: Cardiovascular Disease

## 2024-03-23 VITALS — BP 130/80 | HR 76 | Ht 66.0 in | Wt 218.0 lb

## 2024-03-23 DIAGNOSIS — G4733 Obstructive sleep apnea (adult) (pediatric): Secondary | ICD-10-CM

## 2024-03-23 DIAGNOSIS — E782 Mixed hyperlipidemia: Secondary | ICD-10-CM | POA: Diagnosis not present

## 2024-03-23 DIAGNOSIS — R002 Palpitations: Secondary | ICD-10-CM

## 2024-03-23 NOTE — Patient Instructions (Addendum)
 Medication Instructions:  Your physician recommends that you continue on your current medications as directed. Please refer to the Current Medication list given to you today.  *If you need a refill on your cardiac medications before your next appointment, please call your pharmacy*  Lab Work: Today- Lipid/liver panel & LP(a)  If you have labs (blood work) drawn today and your tests are completely normal, you will receive your results only by: MyChart Message (if you have MyChart) OR A paper copy in the mail If you have any lab test that is abnormal or we need to change your treatment, we will call you to review the results.  Testing/Procedures: Dr. Court has ordered a CT coronary calcium  score.   Test locations:  [facility] [place]  [facility] at [facility]  This is $99 out of pocket.   Coronary CalciumScan A coronary calcium  scan is an imaging test used to look for deposits of calcium  and other fatty materials (plaques) in the inner lining of the blood vessels of the heart (coronary arteries). These deposits of calcium  and plaques can partly clog and narrow the coronary arteries without producing any symptoms or warning signs. This puts a person at risk for a heart attack. This test can detect these deposits before symptoms develop. Tell a health care provider about: Any allergies you have. All medicines you are taking, including vitamins, herbs, eye drops, creams, and over-the-counter medicines. Any problems you or family members have had with anesthetic medicines. Any blood disorders you have. Any surgeries you have had. Any medical conditions you have. Whether you are pregnant or may be pregnant. What are the risks? Generally, this is a safe procedure. However, problems may occur, including: Harm to a pregnant woman and her unborn baby. This test involves the use  of radiation. Radiation exposure can be dangerous to a pregnant woman and her unborn baby. If you are pregnant, you generally should not have this procedure done. Slight increase in the risk of cancer. This is because of the radiation involved in the test. What happens before the procedure? No preparation is needed for this procedure. What happens during the procedure? You will undress and remove any jewelry around your neck or chest. You will put on a hospital gown. Sticky electrodes will be placed on your chest. The electrodes will be connected to an electrocardiogram (ECG) machine to record a tracing of the electrical activity of your heart. A CT scanner will take pictures of your heart. During this time, you will be asked to lie still and hold your breath for 2-3 seconds while a picture of your heart is being taken. The procedure may vary among health care providers and hospitals. What happens after the procedure? You can get dressed. You can return to your normal activities. It is up to you to get the results of your test. Ask your health care provider, or the department that is doing the test, when your results will be ready. Summary A coronary calcium  scan is an imaging test used to look for deposits of calcium  and other fatty materials (plaques) in the inner lining of the blood vessels of the heart (coronary arteries). Generally, this is a safe procedure. Tell your health care provider if you are pregnant or may be pregnant. No preparation is needed for this procedure. A CT scanner will take pictures of your heart. You can return to your normal activities after  the scan is done. This information is not intended to replace advice given to you by your health care provider. Make sure you discuss any questions you have with your health care provider. Document Released: 10/17/2007 Document Revised: 03/09/2016 Document Reviewed: 03/09/2016 Elsevier Interactive Patient Education  2017  [employer].   Follow-Up: At [facility], you and your health needs are our priority.  As part of our continuing mission to provide you with exceptional heart care, our providers are all part of one team.  This team includes your primary Cardiologist (physician) and Advanced Practice Providers or APPs (Physician Assistants and Nurse Practitioners) who all work together to provide you with the care you need, when you need it.  Your next appointment:   3-4 month(s)  Provider:   Dorn Lesches, MD

## 2024-03-23 NOTE — Assessment & Plan Note (Signed)
 History of hyperlipidemia started on atorvastatin  40 mg a day approxi-6 months ago by Dr. Joyce for a LDL 212.  Will check a fasting lipid profile today as well as an LP(a).  Of also going to get a coronary calcium  score to restratify.

## 2024-03-23 NOTE — Assessment & Plan Note (Signed)
 History of palpitations in the past with an event monitor that showed PVCs, PACs and 1 short run of nonsustained ventricular tachycardia at 3 AM which he was unaware of.  He does have a history of significant stress on Xanax .  Of last several days she has had increased palpitations.  He cut his caffeine intake from 2 cups of coffee to 1 cup of coffee.  I am going to let him work his self through the stress and we will reevaluate in 3 to 4 months.

## 2024-03-23 NOTE — Assessment & Plan Note (Signed)
 History of obstructive sleep apnea on home sleep study followed by Dr. Burnard in the past.  I am going to refer him back to Dr. Shlomo to reevaluate and make sure that his settings are still appropriate.  Apparently his symptoms were markedly improved when begun on CPAP.

## 2024-03-23 NOTE — Progress Notes (Signed)
 03/23/2024 Arthur Ho   07-02-1975  [id number]  Primary Physician Joyce Norleen BROCKS, MD Primary Cardiologist: Dorn JINNY Lesches MD [facility] [place] [place], [place]  HPI:  Arthur Ho is a 48 y.o.   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  He was referred by Alm Gent, PA-C for evaluation of palpitations.  He is married to a CNA who works at the [employer].  He owns an scientist, forensic.  I last saw him in the office 07/18/2021.  He has no cardiac risk factors.    Both his father and brother have had A. fib and ablation.  He does have a generalized anxiety disorder followed by Dr. Alm Ferretti on as needed Xanax .  He said the Xanax  does improve his palpitations.  I got  a 2D echo that was entirely normal and an event monitor that showed PACs, PVCs and 1 short run of nonsustained ventricular tachycardia at 3 AM which she was unaware of. He thinks that some of his palpitations is related to anxiety as well.   I did get an outpatient sleep study which is positive for sleep apnea and he was began on CPAP by Dr. Burnard which resulted in improvement in his palpitations.  Since I saw him 2-1/2 of years ago he has put on approximately 12 pounds probably from an activity.  Over the last several days he has noted increased frequency and severity of palpitations.  He does admit to being under excessive stress recently.  He cut his caffeine intake down from 2 to 1 cup of coffee a day.  He otherwise denies chest pain or shortness of breath.   Current Meds  Medication Sig   ALPRAZolam  (XANAX ) 0.25 MG tablet Take 1 tablet (0.25 mg total) by mouth at bedtime as needed for anxiety.   atorvastatin  (LIPITOR) 40 MG tablet Take 1 tablet (40 mg total) by mouth daily.   fluocinonide -emollient (LIDEX -E) 0.05 % cream Apply 1 Application topically 2 (two) times daily.     Allergies  Allergen Reactions   Contrast Media [Iodinated Contrast  Media] Palpitations    SOB   Penicillins Anaphylaxis   Sulfa  Antibiotics     Social History   Socioeconomic History   Marital status: Married    Spouse name: Not on file   Number of children: 1   Years of education: Not on file   Highest education level: Not on file  Occupational History   Occupation: advertising account planner    Employer: [employer]  Tobacco Use   Smoking status: Former    Current packs/day: 0.00    Types: Cigarettes    Quit date: 05/04/2008    Years since quitting: 15.8   Smokeless tobacco: Never  Vaping Use   Vaping status: Never Used  Substance and Sexual Activity   Alcohol use: No   Drug use: No   Sexual activity: Yes  Other Topics Concern   Not on file  Social History Narrative   Not on file   Social Drivers of Health   Financial Resource Strain: Low Risk  (10/27/2023)   Overall Financial Resource Strain (CARDIA)    Difficulty of Paying Living Expenses: Not very hard  Food Insecurity: No Food Insecurity (10/27/2023)   Hunger Vital Sign    Worried About Running Out of Food in the Last Year: Never true    Ran Out of Food in the Last Year: Never true  Transportation Needs: No Transportation Needs (10/27/2023)   PRAPARE - Administrator, Civil Service (Medical): No    Lack of Transportation (Non-Medical): No  Physical Activity: Insufficiently Active (10/27/2023)   Exercise Vital Sign    Days of Exercise per Week: 2 days    Minutes of Exercise per Session: 60 min  Stress: Stress Concern Present (10/27/2023)   [employer] of Occupational Health - Occupational Stress Questionnaire    Feeling of Stress: Rather much  Social Connections: Moderately Isolated (10/27/2023)   Social Connection and Isolation Panel    Frequency of Communication with Friends and Family: More than three times a week    Frequency of Social Gatherings with Friends and Family: Once a week    Attends Religious Services: Never    Database Administrator or  Organizations: No    Attends Banker Meetings: Never    Marital Status: Married  Catering Manager Violence: Not At Risk (10/27/2023)   Humiliation, Afraid, Rape, and Kick questionnaire    Fear of Current or Ex-Partner: No    Emotionally Abused: No    Physically Abused: No    Sexually Abused: No     Review of Systems: General: negative for chills, fever, night sweats or weight changes.  Cardiovascular: negative for chest pain, dyspnea on exertion, edema, orthopnea, palpitations, paroxysmal nocturnal dyspnea or shortness of breath Dermatological: negative for rash Respiratory: negative for cough or wheezing Urologic: negative for hematuria Abdominal: negative for nausea, vomiting, diarrhea, bright red blood per rectum, melena, or hematemesis Neurologic: negative for visual changes, syncope, or dizziness All other systems reviewed and are otherwise negative except as noted above.    Blood pressure 130/80, pulse 76, height 5' 6 (1.676 m), weight 218 lb (98.9 kg), SpO2 97%.  General appearance: alert and no distress Neck: no adenopathy, no carotid bruit, no JVD, supple, symmetrical, trachea midline, and thyroid  not enlarged, symmetric, no tenderness/mass/nodules Lungs: clear to auscultation bilaterally Heart: regular rate and rhythm, S1, S2 normal, no murmur, click, rub or gallop Extremities: extremities normal, atraumatic, no cyanosis or edema Pulses: 2+ and symmetric Skin: Skin color, texture, turgor normal. No rashes or lesions Neurologic: Grossly normal  EKG EKG Interpretation Date/Time:  Thursday March 23 2024 14:03:46 EST Ventricular Rate:  76 PR Interval:  134 QRS Duration:  70 QT Interval:  360 QTC Calculation: 405 R Axis:   -26  Text Interpretation: Normal sinus rhythm Normal ECG When compared with ECG of 15-Feb-2017 09:38, QRS axis Shifted left Confirmed by Court Carrier [phone]) on 03/23/2024 2:07:53 PM    ASSESSMENT AND PLAN:    Palpitations History of palpitations in the past with an event monitor that showed PVCs, PACs and 1 short run of nonsustained ventricular tachycardia at 3 AM which he was unaware of.  He does have a history of significant stress on Xanax .  Of last several days she has had increased palpitations.  He cut his caffeine intake from 2 cups of coffee to 1 cup of coffee.  I am going to let him work his self through the stress and we will reevaluate in 3 to 4 months.  Obstructive sleep apnea History of obstructive sleep apnea on home sleep study followed by Dr. Burnard in the past.  I am going to refer him back to Dr. Shlomo to reevaluate and make sure that his settings are still appropriate.  Apparently his symptoms were markedly improved when begun on CPAP.  Hyperlipidemia History of hyperlipidemia started on atorvastatin   40 mg a day approxi-6 months ago by Dr. Joyce for a LDL 212.  Will check a fasting lipid profile today as well as an LP(a).  Of also going to get a coronary calcium  score to restratify.     Dorn DOROTHA Lesches MD FACP,FACC,FAHA, [facility] 03/23/2024 2:20 PM

## 2024-03-24 ENCOUNTER — Ambulatory Visit: Payer: Self-pay | Admitting: Cardiovascular Disease

## 2024-03-27 LAB — HEPATIC FUNCTION PANEL
ALT: 56 IU/L — ABNORMAL HIGH (ref 0–44)
AST: 31 IU/L (ref 0–40)
Albumin: 5.1 g/dL (ref 4.1–5.1)
Alkaline Phosphatase: 64 IU/L (ref 47–123)
Bilirubin Total: 0.6 mg/dL (ref 0.0–1.2)
Bilirubin, Direct: 0.22 mg/dL (ref 0.00–0.40)
Total Protein: 7.7 g/dL (ref 6.0–8.5)

## 2024-03-27 LAB — LIPID PANEL
Chol/HDL Ratio: 3.2 ratio (ref 0.0–5.0)
Cholesterol, Total: 161 mg/dL (ref 100–199)
HDL: 51 mg/dL (ref >39)
LDL Chol Calc (NIH): 93 mg/dL (ref 0–99)
Triglycerides: 93 mg/dL (ref 0–149)
VLDL Cholesterol Cal: 17 mg/dL (ref 5–40)

## 2024-03-27 LAB — LIPOPROTEIN A (LPA): Lipoprotein (a): 36.2 nmol/L (ref <75.0)

## 2024-04-05 ENCOUNTER — Ambulatory Visit (HOSPITAL_COMMUNITY)
Admission: RE | Admit: 2024-04-05 | Discharge: 2024-04-05 | Disposition: A | Payer: Self-pay | Source: Ambulatory Visit | Attending: Cardiovascular Disease | Admitting: Cardiovascular Disease

## 2024-04-05 DIAGNOSIS — E782 Mixed hyperlipidemia: Secondary | ICD-10-CM | POA: Insufficient documentation

## 2024-05-08 ENCOUNTER — Ambulatory Visit (INDEPENDENT_AMBULATORY_CARE_PROVIDER_SITE_OTHER): Admitting: Psychology

## 2024-05-08 DIAGNOSIS — F411 Generalized anxiety disorder: Secondary | ICD-10-CM | POA: Diagnosis not present

## 2024-05-08 NOTE — Progress Notes (Signed)
 "                                                                                                                                                                                                                                    05/08/2024  Treatment Plan: Diagnosis F41.1 (Generalized anxiety disorder) [n/a]  Symptoms Excessive and/or unrealistic worry that is difficult to control occurring more days than not for at least 6 months about a number of events or activities. (Status: maintained) -- No Description Entered  Hypervigilance (e.g., feeling constantly on edge, experiencing concentration difficulties, having trouble falling or staying asleep, exhibiting a general state of irritability). (Status: maintained) -- No Description Entered  Medication Status compliance  Safety none  If Suicidal or Homicidal State Action Taken: unspecified  Current Risk: low Medications xanax  (Dosage: unknown)  Objectives Related Problem: Learn and implement coping skills that result in a reduction of anxiety and worry, and improved daily functioning. Description: Reestablish a consistent sleep-wake cycle. Target Date: 2024-04-28 Frequency: Daily Modality: individual Progress: 90%  Related Problem: Learn and implement coping skills that result in a reduction of anxiety and worry, and improved daily functioning. Description: Maintain involvement in work, family, and social activities. Target Date: 2025-04-26 Frequency: Daily Modality: individual Progress: 90%  Related Problem: Learn and implement coping skills that result in a reduction of anxiety and worry, and improved daily functioning. Description: Learn and implement problem-solving strategies for realistically addressing  worries. Target Date: 2025-04-27 Frequency: Daily Modality: individual Progress: 85%  Related Problem: Learn and implement coping skills that result in a reduction of anxiety and worry, and improved daily functioning. Description: Complete a medical evaluation to assess for possible contribution of medical or substance-related conditions to the anxiety. Target Date: 2025-04-27 Frequency: Daily Modality: individual Progress: 95%  Related Problem: Learn and implement coping skills that result in a reduction of anxiety and worry, and improved daily functioning. Description: Describe situations, thoughts, feelings, and actions associated with anxieties and worries, their impact on functioning, and attempts to resolve them. Target Date: 2025-04-27 Frequency: Daily Modality: individual Progress: 85%  Client Response full compliance  Service Location Location, [address] [place], [place] [address]  Service Code cpt [phone] P Identify automatic thoughts  Facilitate problem solving  Session Notes:  Dx.: Generalized Anxiety  Meds.: Xanax   Patient consents to have a video Caregility video session and understands the limitations of this platform. He is at  home and I am at my home office.   Goals: States that he is seeking therapy to reduce extreme anxiety and the associated cognitive and physical distress. Tends to have a variety of somatic complaints that he hopes to resolve. Will utilize FOO exploration and cognitive behavioral strategies. Target date is 5-22. Revised target is 12-26. Session notes: Arthur Ho states that he had a great year with his company and is very encouraged for the new year. He had a period of anxiety that resulted in heart palpitations. His sister shared that her husband was cheating on her. This left Arthur Ho questioning his own marriage, which made him symptomatic. Went to the cardiologist. All tests were normal. He talked with wife and she shared that he had been short  and irritable lately. Once they talked, he felt reassured and more relaxed. He has recommitted to being available to the family. Is feeling much better and calmer. Says he is trying to make adjustments and is doing better. He decided to help his anxiety about stopping the news and is gaming less.                                                                                                                    CONI ALM KERNS, PhD Time: 2:10p-3:00a 50 min.    "

## 2024-05-16 ENCOUNTER — Encounter: Payer: Self-pay | Admitting: Family Medicine

## 2024-05-16 DIAGNOSIS — F411 Generalized anxiety disorder: Secondary | ICD-10-CM

## 2024-05-16 MED ORDER — ALPRAZOLAM 0.25 MG PO TABS: 0.2500 mg | ORAL_TABLET | Freq: Every evening | ORAL | 0 refills | Status: AC | PRN

## 2024-06-07 ENCOUNTER — Ambulatory Visit: Admitting: Psychology

## 2024-06-07 DIAGNOSIS — F411 Generalized anxiety disorder: Secondary | ICD-10-CM

## 2024-06-07 NOTE — Progress Notes (Signed)
 "   06/07/2024  Treatment Plan: Diagnosis F41.1 (Generalized anxiety disorder) [n/a]  Symptoms Excessive and/or unrealistic worry that is difficult to control occurring more days than not for at least 6 months about a number of events or activities. (Status: maintained) -- No Description Entered  Hypervigilance (e.g., feeling constantly on edge, experiencing concentration difficulties, having trouble falling or staying asleep, exhibiting a general state of irritability). (Status: maintained) -- No Description Entered  Medication Status compliance  Safety none  If Suicidal or Homicidal State Action Taken: unspecified  Current Risk: low Medications xanax  (Dosage: unknown)  Objectives Related Problem: Learn and implement coping skills that result in a reduction of anxiety and worry, and improved daily functioning. Description: Reestablish a consistent sleep-wake cycle. Target Date: 2024-04-28 Frequency: Daily Modality: individual Progress: 90%  Related Problem: Learn and implement coping skills that result in a reduction of anxiety and worry, and improved daily functioning. Description: Maintain involvement in work, family, and social activities. Target Date: 2025-04-26 Frequency: Daily Modality: individual Progress: 90%  Related Problem: Learn and implement coping skills that result in a reduction of anxiety and worry, and improved daily functioning. Description: Learn and implement problem-solving strategies for realistically addressing worries. Target Date: 2025-04-27 Frequency: Daily Modality: individual Progress: 85%  Related Problem: Learn and implement coping skills that result in a reduction of anxiety and worry, and improved daily functioning. Description: Complete a medical evaluation to assess for possible contribution of medical or substance-related conditions to the anxiety. Target Date: 2025-04-27 Frequency: Daily Modality: individual Progress: 95%  Related  Problem: Learn and implement coping skills that result in a reduction of anxiety and worry, and improved daily functioning. Description: Describe situations, thoughts, feelings, and actions associated with anxieties and worries, their impact on functioning, and attempts to resolve them. Target Date: 2025-04-27 Frequency: Daily Modality: individual Progress: 85%  Client Response full compliance  Service Location Location, [address] [place], [place] [address]  Service Code cpt [phone] P Identify automatic thoughts  Facilitate problem solving  Session Notes:  Dx.: Generalized Anxiety  Meds.: Xanax   Patient consents to have a video Caregility video session and understands the limitations of this platform. He is at home and I am at my home office.   Goals: States that he is seeking therapy to reduce extreme anxiety and the associated cognitive and physical distress. Tends to have a variety of somatic complaints that he hopes to resolve. Will utilize FOO exploration and cognitive behavioral strategies. Target date is 5-22. Revised target is 12-26. 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 He is doing well, but the weather lately has made things more difficult. States that he has cut back on gaming to spend time with family. Reports his heart palpitations are reduced. He does say his sleep is problematic. He wakes up early and cannot get back to sleep. He is getting about 5-6 hours sleep per night. We talked about cognitive strategies to distract himself at night (counting, alphabet) and the need to employ.  CONI ALM KERNS, PhD Time: 11:40a-12:30p 50 min.    "

## 2024-06-27 ENCOUNTER — Ambulatory Visit: Admitting: Cardiovascular Disease

## 2024-07-06 ENCOUNTER — Ambulatory Visit: Admitting: Psychology

## 2024-10-31 ENCOUNTER — Encounter: Payer: Self-pay | Admitting: Family Medicine
